# Patient Record
Sex: Female | Born: 1980 | Race: Black or African American | Hispanic: No | Marital: Single | Smoking: Never smoker
Health system: Southern US, Community
[De-identification: ages and names within clinical notes are randomized; demographics above are authoritative.]

## PROBLEM LIST (undated history)

## (undated) ENCOUNTER — Inpatient Hospital Stay (HOSPITAL_COMMUNITY): Payer: Self-pay

## (undated) DIAGNOSIS — N390 Urinary tract infection, site not specified: Secondary | ICD-10-CM

## (undated) DIAGNOSIS — N12 Tubulo-interstitial nephritis, not specified as acute or chronic: Secondary | ICD-10-CM

## (undated) DIAGNOSIS — N83209 Unspecified ovarian cyst, unspecified side: Secondary | ICD-10-CM

## (undated) HISTORY — PX: WISDOM TOOTH EXTRACTION: SHX21

## (undated) HISTORY — PX: NO PAST SURGERIES: SHX2092

---

## 2002-11-13 ENCOUNTER — Inpatient Hospital Stay (HOSPITAL_COMMUNITY): Admission: AD | Admit: 2002-11-13 | Discharge: 2002-11-13 | Payer: Self-pay | Admitting: Family Medicine

## 2003-02-26 ENCOUNTER — Encounter: Payer: Self-pay | Admitting: Emergency Medicine

## 2003-02-26 ENCOUNTER — Emergency Department (HOSPITAL_COMMUNITY): Admission: EM | Admit: 2003-02-26 | Discharge: 2003-02-26 | Payer: Self-pay | Admitting: Emergency Medicine

## 2003-03-05 ENCOUNTER — Emergency Department (HOSPITAL_COMMUNITY): Admission: EM | Admit: 2003-03-05 | Discharge: 2003-03-05 | Payer: Self-pay | Admitting: *Deleted

## 2003-07-05 ENCOUNTER — Emergency Department (HOSPITAL_COMMUNITY): Admission: EM | Admit: 2003-07-05 | Discharge: 2003-07-05 | Payer: Self-pay | Admitting: Emergency Medicine

## 2004-04-28 ENCOUNTER — Ambulatory Visit: Payer: Self-pay | Admitting: Obstetrics and Gynecology

## 2004-04-30 ENCOUNTER — Ambulatory Visit: Payer: Self-pay | Admitting: Family Medicine

## 2004-04-30 ENCOUNTER — Inpatient Hospital Stay (HOSPITAL_COMMUNITY): Admission: AD | Admit: 2004-04-30 | Discharge: 2004-05-02 | Payer: Self-pay | Admitting: *Deleted

## 2008-04-21 ENCOUNTER — Emergency Department (HOSPITAL_BASED_OUTPATIENT_CLINIC_OR_DEPARTMENT_OTHER): Admission: EM | Admit: 2008-04-21 | Discharge: 2008-04-21 | Payer: Self-pay | Admitting: Emergency Medicine

## 2011-04-17 ENCOUNTER — Encounter (HOSPITAL_COMMUNITY): Payer: Self-pay

## 2011-04-17 ENCOUNTER — Inpatient Hospital Stay (HOSPITAL_COMMUNITY)
Admission: AD | Admit: 2011-04-17 | Discharge: 2011-04-17 | Disposition: A | Discharge status: Home / Self Care | Payer: PRIVATE HEALTH INSURANCE | Source: Ambulatory Visit | Attending: Obstetrics & Gynecology | Admitting: Obstetrics & Gynecology

## 2011-04-17 ENCOUNTER — Inpatient Hospital Stay (HOSPITAL_COMMUNITY): Payer: PRIVATE HEALTH INSURANCE

## 2011-04-17 DIAGNOSIS — A499 Bacterial infection, unspecified: Secondary | ICD-10-CM

## 2011-04-17 DIAGNOSIS — O2650 Maternal hypotension syndrome, unspecified trimester: Secondary | ICD-10-CM | POA: Insufficient documentation

## 2011-04-17 DIAGNOSIS — B3731 Acute candidiasis of vulva and vagina: Secondary | ICD-10-CM

## 2011-04-17 DIAGNOSIS — B9689 Other specified bacterial agents as the cause of diseases classified elsewhere: Secondary | ICD-10-CM

## 2011-04-17 DIAGNOSIS — R51 Headache: Secondary | ICD-10-CM | POA: Insufficient documentation

## 2011-04-17 DIAGNOSIS — R109 Unspecified abdominal pain: Secondary | ICD-10-CM | POA: Insufficient documentation

## 2011-04-17 DIAGNOSIS — N760 Acute vaginitis: Secondary | ICD-10-CM

## 2011-04-17 DIAGNOSIS — B373 Candidiasis of vulva and vagina: Secondary | ICD-10-CM

## 2011-04-17 DIAGNOSIS — O211 Hyperemesis gravidarum with metabolic disturbance: Secondary | ICD-10-CM

## 2011-04-17 LAB — BASIC METABOLIC PANEL
BUN: 10
CO2: 25
Calcium: 9.3
Chloride: 105
Creatinine, Ser: 0.8
GFR calc Af Amer: >60
GFR calc non Af Amer: >60
Glucose, Bld: 106 — ABNORMAL HIGH
Potassium: 3.7
Sodium: 139

## 2011-04-17 LAB — DIFFERENTIAL
Basophils Absolute: 0
Basophils Relative: 0
Eosinophils Absolute: 0
Eosinophils Relative: 0
Lymphocytes Relative: 22
Lymphs Abs: 2
Monocytes Absolute: 0.9
Monocytes Relative: 10
Neutro Abs: 6.4
Neutrophils Relative %: 68

## 2011-04-17 LAB — CBC
HCT: 35.3 — ABNORMAL LOW
HCT: 32.2 % — ABNORMAL LOW (ref 36.0–46.0)
Hemoglobin: 12.1
Hemoglobin: 11.1 g/dL — ABNORMAL LOW (ref 12.0–15.0)
MCH: 30.1 pg (ref 26.0–34.0)
MCHC: 34.3
MCHC: 34.5 g/dL (ref 30.0–36.0)
MCV: 87.1
MCV: 87.3 fL (ref 78.0–100.0)
Platelets: 181
Platelets: 213 10*3/uL (ref 150–400)
RBC: 4.05
RBC: 3.69 MIL/uL — ABNORMAL LOW (ref 3.87–5.11)
RDW: 11.9
RDW: 12 % (ref 11.5–15.5)
WBC: 9.3
WBC: 9.2 10*3/uL (ref 4.0–10.5)

## 2011-04-17 LAB — URINE MICROSCOPIC-ADD ON

## 2011-04-17 LAB — URINALYSIS, ROUTINE W REFLEX MICROSCOPIC
Bilirubin Urine: NEGATIVE
Glucose, UA: NEGATIVE
Glucose, UA: NEGATIVE mg/dL
Hgb urine dipstick: NEGATIVE
Ketones, ur: NEGATIVE
Ketones, ur: 40 mg/dL — AB
Leukocytes, UA: NEGATIVE
Nitrite: NEGATIVE
Nitrite: NEGATIVE
Protein, ur: 30 — AB
Protein, ur: NEGATIVE mg/dL
Specific Gravity, Urine: 1.019
Specific Gravity, Urine: >1.03 — ABNORMAL HIGH (ref 1.005–1.030)
Urobilinogen, UA: 1
Urobilinogen, UA: 1 mg/dL (ref 0.0–1.0)
pH: 5.5
pH: 6 (ref 5.0–8.0)

## 2011-04-17 LAB — URINE CULTURE: Colony Count: >=100000

## 2011-04-17 LAB — COMPREHENSIVE METABOLIC PANEL
ALT: 12 U/L (ref 0–35)
AST: 17 U/L (ref 0–37)
Albumin: 3.7 g/dL (ref 3.5–5.2)
Alkaline Phosphatase: 36 U/L — ABNORMAL LOW (ref 39–117)
BUN: 12 mg/dL (ref 6–23)
CO2: 25 mEq/L (ref 19–32)
Calcium: 9.5 mg/dL (ref 8.4–10.5)
Chloride: 103 mEq/L (ref 96–112)
Creatinine, Ser: 0.5 mg/dL (ref 0.50–1.10)
GFR calc Af Amer: >90 mL/min (ref >90)
GFR calc non Af Amer: >90 mL/min (ref >90)
Glucose, Bld: 78 mg/dL (ref 70–99)
Potassium: 3.9 mEq/L (ref 3.5–5.1)
Sodium: 135 mEq/L (ref 135–145)
Total Bilirubin: 0.8 mg/dL (ref 0.3–1.2)
Total Protein: 6.6 g/dL (ref 6.0–8.3)

## 2011-04-17 LAB — PREGNANCY, URINE: Preg Test, Ur: NEGATIVE

## 2011-04-17 LAB — HCG, QUANTITATIVE, PREGNANCY: hCG, Beta Chain, Quant, S: 128009 m[IU]/mL — ABNORMAL HIGH (ref <5)

## 2011-04-17 LAB — POCT PREGNANCY, URINE: Preg Test, Ur: POSITIVE

## 2011-04-17 LAB — WET PREP, GENITAL
Trich, Wet Prep: NONE SEEN
Yeast Wet Prep HPF POC: NONE SEEN

## 2011-04-17 MED ORDER — ONDANSETRON 8 MG PO TBDP
8.0000 mg | ORAL_TABLET | Freq: Once | ORAL | Status: AC
  Administered 2011-04-17: 8 mg via ORAL
  Filled 2011-04-17: qty 1

## 2011-04-17 MED ORDER — PROMETHAZINE HCL 25 MG PO TABS: 25.0000 mg | ORAL_TABLET | Freq: Four times a day (QID) | ORAL | Status: DC | PRN

## 2011-04-17 MED ORDER — PRENATAL RX 60-1 MG PO TABS: 1.0000 | ORAL_TABLET | Freq: Every day | ORAL | Status: DC

## 2011-04-17 MED ORDER — ONDANSETRON 8 MG PO TBDP: 8.0000 mg | ORAL_TABLET | Freq: Three times a day (TID) | ORAL | Status: AC | PRN

## 2011-04-17 MED ORDER — ACETAMINOPHEN 325 MG PO TABS
650.0000 mg | ORAL_TABLET | Freq: Once | ORAL | Status: AC
  Administered 2011-04-17: 650 mg via ORAL
  Filled 2011-04-17: qty 2

## 2011-04-17 MED ORDER — DEXTROSE 5 % IN LACTATED RINGERS IV BOLUS
1000.0000 mL | Freq: Once | INTRAVENOUS | Status: AC
  Administered 2011-04-17: 1000 mL via INTRAVENOUS

## 2011-04-17 MED ORDER — NYSTATIN-TRIAMCINOLONE 100000-0.1 UNIT/GM-% EX OINT: TOPICAL_OINTMENT | Freq: Two times a day (BID) | CUTANEOUS | Status: DC

## 2011-04-17 NOTE — ED Provider Notes (Signed)
History     Chief Complaint  Patient presents with  . Emesis   The history is provided by the patient.    Pt is? [redacted] weeks pregnant with LMP? 7/26 G3P2 with nausea and vomiting for 3 weeks and has progressively gotten worse since 9/29 and has not been able to eat or drink anything since.  She denies spotting or bleeding but has had cramping with vomiting. She has had a headache- diffuse frontal and radiating across the top of her head.  She has not taken any medication for the pain or for the nausea/vomiting. She has a mod white vaginal discharge with odor with a history of BV.  She is a Consulting civil engineer at [employer].  She had no complications with her previous pregnancies.  She denies UTI symtpoms, but some pressure with urination.  She took the kids to school this morning and then came back home and started vomiting and then went to the bathroom and threw up and passed out- does not think she hit her head.    No past medical history on file.  No past surgical history on file.  No family history on file.  History  Substance Use Topics  . Smoking status: Not on file  . Smokeless tobacco: Not on file  . Alcohol Use: Not on file    Allergies: No Known Allergies  No prescriptions prior to admission    Review of Systems  Constitutional: Negative for fever and chills.  Gastrointestinal: Positive for nausea, vomiting and abdominal pain. Negative for diarrhea and constipation.  Genitourinary: Negative for dysuria, urgency and frequency.  Neurological: Positive for dizziness and headaches.   Physical Exam   Blood pressure 119/59, pulse 86, temperature 99.3 F (37.4 C), temperature source Oral, resp. rate 16, height 5\' 3"  (1.6 m), weight 115 lb (52.164 kg), last menstrual period 02/10/2011, SpO2 98.00%.  Physical Exam  Vitals reviewed. Constitutional: She is oriented to person, place, and time. She appears well-developed and well-nourished.  Eyes: Pupils are equal, round, and reactive to light.    Neck: Normal range of motion. Neck supple.  Cardiovascular: Normal rate.   Respiratory: Effort normal.  GI: Soft. She exhibits no distension. There is no tenderness.  Genitourinary:       Several flat whitened patches on mons and left labia that pt says are itching- no erythema noted or raised areas Mod amount of frothy white discharge in vault; cervix clean nontender; uterus 10week size nontender; adnexal without palpable enlargement or tenderness  Musculoskeletal: Normal range of motion.  Neurological: She is alert and oriented to person, place, and time.  Skin: Skin is warm and dry.  Psychiatric: She has a normal mood and affect.    MAU Course  Procedures Pelvic exam with wet prep-mod clue cells GC/chlamydia- pending Urinalysis-40 ketones HCG-Quant CBC CMET IVF with D5LR and Zofran ODT-pt's nausea better but headache uncomfortable Tylenol ordered Ultrasound showed 10week single living IUP with FHR 170 and no abnormalities visualized    Assessment and Plan  Hyperemesis in early pregnancy with dehydration- IVF and antiemetics Near syncope/syncope episode Headache in pregnancy Cramping in pregnancy- nl ultrasound 10 weeks IUP  Lori Wolfe 04/17/2011, 11:46 AM

## 2011-04-17 NOTE — Progress Notes (Signed)
Pt states she passed out this am. Not sure if she hit anything. Has a headache but has had it for a while.

## 2011-04-17 NOTE — Progress Notes (Signed)
Pt states she has been having nausea and vomiting for a couple of weeks, for the last 3 days cannot keep anything down, slight fever and a headache.

## 2011-04-17 NOTE — ED Provider Notes (Signed)
Attestation of Attending Supervision of Advanced Practitioner: Evaluation and management procedures were performed by the PA/NP/CNM/OB Fellow under my supervision/collaboration. Chart reviewed and agree with management and plan.  Lori Wolfe A 04/17/2011 4:55 PM

## 2011-04-18 LAB — GC/CHLAMYDIA PROBE AMP, GENITAL
Chlamydia, DNA Probe: NEGATIVE
GC Probe Amp, Genital: NEGATIVE

## 2011-04-27 ENCOUNTER — Inpatient Hospital Stay (HOSPITAL_COMMUNITY): Payer: PRIVATE HEALTH INSURANCE

## 2011-04-27 ENCOUNTER — Inpatient Hospital Stay (HOSPITAL_COMMUNITY)
Admission: AD | Admit: 2011-04-27 | Discharge: 2011-04-27 | Disposition: A | Discharge status: Home / Self Care | Payer: PRIVATE HEALTH INSURANCE | Source: Ambulatory Visit | Attending: Obstetrics & Gynecology | Admitting: Obstetrics & Gynecology

## 2011-04-27 ENCOUNTER — Encounter (HOSPITAL_COMMUNITY): Payer: Self-pay | Admitting: *Deleted

## 2011-04-27 DIAGNOSIS — O418X90 Other specified disorders of amniotic fluid and membranes, unspecified trimester, not applicable or unspecified: Secondary | ICD-10-CM

## 2011-04-27 DIAGNOSIS — O468X9 Other antepartum hemorrhage, unspecified trimester: Secondary | ICD-10-CM

## 2011-04-27 DIAGNOSIS — O209 Hemorrhage in early pregnancy, unspecified: Secondary | ICD-10-CM

## 2011-04-27 HISTORY — DX: Tubulo-interstitial nephritis, not specified as acute or chronic: N12

## 2011-04-27 HISTORY — DX: Urinary tract infection, site not specified: N39.0

## 2011-04-27 LAB — CBC
HCT: 32.9 % — ABNORMAL LOW (ref 36.0–46.0)
Hemoglobin: 11.3 g/dL — ABNORMAL LOW (ref 12.0–15.0)
MCH: 30.1 pg (ref 26.0–34.0)
RBC: 3.75 MIL/uL — ABNORMAL LOW (ref 3.87–5.11)

## 2011-04-27 LAB — ABO/RH: ABO/RH(D): O NEG

## 2011-04-27 MED ORDER — RHO D IMMUNE GLOBULIN 1500 UNIT/2ML IJ SOLN
300.0000 ug | Freq: Once | INTRAMUSCULAR | Status: AC
  Administered 2011-04-27: 300 ug via INTRAMUSCULAR
  Filled 2011-04-27: qty 2

## 2011-04-27 NOTE — Progress Notes (Signed)
Pt brought into triage, bleeding through jeans. Taken directly to room 6.

## 2011-04-27 NOTE — Progress Notes (Signed)
Sudden onset cramping and bleeding about 1 hour ago. Soaked through [employer] streamer.

## 2011-04-27 NOTE — ED Provider Notes (Signed)
History   Pt presents today c/o heavy vag bleeding. She states she is about [redacted]wks pregnant. She began cramping earlier today and then noticed heavy bleeding. She denies recent intercourse, vag dc, irritation, fever, or any other sx at this time.  Chief Complaint  Patient presents with  . Vaginal Bleeding   HPI  OB History    Grav Para Term Preterm Abortions TAB SAB Ect Mult Living   3 2 2  0  0 0 0 0 2      Past Medical History  Diagnosis Date  . Pyelonephritis   . Urinary tract infection     Past Surgical History  Procedure Date  . No past surgeries     No family history on file.  History  Substance Use Topics  . Smoking status: Never Smoker   . Smokeless tobacco: Never Used  . Alcohol Use: No    Allergies: No Known Allergies  Prescriptions prior to admission  Medication Sig Dispense Refill  . acetaminophen (TYLENOL) 500 MG tablet Take 1,000 mg by mouth every 6 (six) hours as needed. For pain or headache       . ondansetron (ZOFRAN-ODT) 8 MG disintegrating tablet Take 8 mg by mouth every 8 (eight) hours as needed. For nausea       . Prenatal Vit-Fe Fumarate-FA (PRENATAL MULTIVITAMIN) 60-1 MG tablet Take 1 tablet by mouth daily.  30 tablet  5  . promethazine (PHENERGAN) 25 MG tablet Take 25 mg by mouth every 6 (six) hours as needed. For nausea       . nystatin-triamcinolone (MYCOLOG) ointment Apply topically 2 (two) times daily.  30 g  0    Review of Systems  Constitutional: Negative for fever.  Cardiovascular: Negative for chest pain.  Gastrointestinal: Positive for abdominal pain. Negative for nausea, vomiting, diarrhea and constipation.  Genitourinary: Negative for dysuria, urgency, frequency and hematuria.  Neurological: Negative for dizziness and headaches.  Psychiatric/Behavioral: Negative for depression and suicidal ideas.   Physical Exam   Blood pressure 119/68, pulse 91, temperature 98.4 F (36.9 C), temperature source Oral, resp. rate 18, height 5\' 4"   (1.626 m), weight 117 lb (53.071 kg), last menstrual period 02/10/2011.  Physical Exam  Nursing note and vitals reviewed. Constitutional: She is oriented to person, place, and time. She appears well-developed and well-nourished. No distress.  HENT:  Head: Normocephalic and atraumatic.  GI: Soft. She exhibits no distension. There is no tenderness. There is no rebound and no guarding.  Genitourinary: There is bleeding around the vagina. No vaginal discharge found.       Cervix Lg/closed. Pt with moderate amount of bleeding noted on exam. Pt nontender on exam.  Neurological: She is alert and oriented to person, place, and time.  Skin: Skin is warm and dry. She is not diaphoretic.  Psychiatric: She has a normal mood and affect. Her behavior is normal. Judgment and thought content normal.    MAU Course  Procedures RADIOLOGY REPORT*  Clinical Data: Positive pace test with vaginal bleeding  OBSTETRIC <14 WK ULTRASOUND  Technique: Transabdominal ultrasound was performed for evaluation  of the gestation as well as the maternal uterus and adnexal  regions.  Comparison: 04/17/2011  Intrauterine gestational sac: Single. Normal shape.  Yolk sac: Not visualized  Embryo: Visualized  Cardiac Activity: Visualized  Heart Rate: 153 bpm  CRL: 5.0 mm 11w 6d [place] EDC: 11/10/2011  Maternal uterus/Adnexae:  Tiny subchorionic hemorrhage is evident. No evidence for adnexal  mass. No intraperitoneal free fluid.  IMPRESSION:  Single living intrauterine gestation at estimated 11-week-6-day  gestational age by crown-rump length.     Assessment and Plan  Pt care turned over to Nolene Bernheim, FNP at 2003.   Assessment: Bleeding in pregnancy Tiny subchorionic hemorrhage  Plan: Pelvic rest - no tampons, no douching, no sex until you begin prenatal care. Take a prenatal vitamin every day Begin prenatal care as soon as possible.   Clinton Gallant. Rice III, DrHSc, MPAS, PA-C  04/27/2011, 6:39 PM    Henrietta Hoover, PA 04/27/11 2003  Nolene Bernheim, NP 04/27/11 2023

## 2011-04-27 NOTE — Progress Notes (Signed)
Pt. states she has been seen in MAU, had U/S to confirm IUP and dates. Has rx for Phenergan and Zofran. Has had some constipation and is taking Colace.

## 2011-04-28 LAB — RH IG WORKUP (INCLUDES ABO/RH)
Antibody Screen: NEGATIVE
Gestational Age(Wks): 10.6

## 2011-04-29 NOTE — ED Provider Notes (Signed)
Agree with above note.  [name] 04/29/2011 7:55 AM

## 2011-07-17 NOTE — L&D Delivery Note (Signed)
Delivery Note At 5:19 PM a viable female was delivered via Vaginal, Spontaneous Delivery (Presentation: ;  ).  APGAR: 9, 10; weight 6 lb 4 oz (2835 g).   Placenta status: Intact, Spontaneous.  Cord: 3 vessels with the following complications: None.  Cord pH: 7.32  Anesthesia: Epidural  Episiotomy: None Lacerations: None Suture Repair: none Est. Blood Loss (mL): 200  Mom to postpartum.  Baby to nursery-stable.  HARPER,CHARLES A 11/06/2011, 9:49 AM

## 2011-08-27 ENCOUNTER — Other Ambulatory Visit: Payer: Self-pay | Admitting: Obstetrics & Gynecology

## 2011-08-31 ENCOUNTER — Inpatient Hospital Stay (HOSPITAL_COMMUNITY)
Admission: AD | Admit: 2011-08-31 | Discharge: 2011-08-31 | Disposition: A | Discharge status: Home / Self Care | Payer: Medicaid Other | Source: Ambulatory Visit | Attending: Obstetrics | Admitting: Obstetrics

## 2011-08-31 DIAGNOSIS — Z298 Encounter for other specified prophylactic measures: Secondary | ICD-10-CM | POA: Insufficient documentation

## 2011-08-31 DIAGNOSIS — Z2989 Encounter for other specified prophylactic measures: Secondary | ICD-10-CM | POA: Insufficient documentation

## 2011-08-31 DIAGNOSIS — Z3480 Encounter for supervision of other normal pregnancy, unspecified trimester: Secondary | ICD-10-CM | POA: Insufficient documentation

## 2011-08-31 MED ORDER — RHO D IMMUNE GLOBULIN 1500 UNIT/2ML IJ SOLN
300.0000 ug | Freq: Once | INTRAMUSCULAR | Status: AC
  Administered 2011-08-31: 300 ug via INTRAMUSCULAR
  Filled 2011-08-31: qty 2

## 2011-08-31 MED ORDER — RHO D IMMUNE GLOBULIN 1500 UNIT/2ML IJ SOLN: 300.0000 ug | Freq: Once | INTRAMUSCULAR | Status: DC

## 2011-08-31 NOTE — Progress Notes (Signed)
Sent from office for rhophyllac.  Handout/info given to pt.  Has received previously, no reaction.  No complaints offered. Time associated with blood drawn and injection discussed.

## 2011-08-31 NOTE — ED Notes (Signed)
Tolerated injection. Pt d/c

## 2011-09-01 LAB — RH IG WORKUP (INCLUDES ABO/RH)
Antibody Screen: NEGATIVE
Unit division: 0

## 2011-11-05 ENCOUNTER — Encounter (HOSPITAL_COMMUNITY): Payer: Self-pay | Admitting: Anesthesiology

## 2011-11-05 ENCOUNTER — Inpatient Hospital Stay (HOSPITAL_COMMUNITY): Payer: Medicaid Other | Admitting: Anesthesiology

## 2011-11-05 ENCOUNTER — Encounter (HOSPITAL_COMMUNITY): Payer: Self-pay | Admitting: *Deleted

## 2011-11-05 ENCOUNTER — Inpatient Hospital Stay (HOSPITAL_COMMUNITY)
Admission: AD | Admit: 2011-11-05 | Discharge: 2011-11-07 | DRG: 775 | Disposition: A | Discharge status: Home / Self Care | Payer: Medicaid Other | Source: Ambulatory Visit | Attending: Obstetrics & Gynecology | Admitting: Obstetrics & Gynecology

## 2011-11-05 DIAGNOSIS — Z22330 Carrier of Group B streptococcus: Secondary | ICD-10-CM

## 2011-11-05 DIAGNOSIS — O99892 Other specified diseases and conditions complicating childbirth: Principal | ICD-10-CM | POA: Diagnosis present

## 2011-11-05 DIAGNOSIS — IMO0001 Reserved for inherently not codable concepts without codable children: Secondary | ICD-10-CM

## 2011-11-05 LAB — CBC
MCH: 28 pg (ref 26.0–34.0)
MCHC: 32.4 g/dL (ref 30.0–36.0)
MCV: 86.6 fL (ref 78.0–100.0)
Platelets: 240 10*3/uL (ref 150–400)
RDW: 13.5 % (ref 11.5–15.5)

## 2011-11-05 MED ORDER — FENTANYL 2.5 MCG/ML BUPIVACAINE 1/10 % EPIDURAL INFUSION (WH - ANES)
INTRAMUSCULAR | Status: DC | PRN
  Administered 2011-11-05: 14 mL/h via EPIDURAL

## 2011-11-05 MED ORDER — FLEET ENEMA 7-19 GM/118ML RE ENEM: 1.0000 | ENEMA | RECTAL | Status: DC | PRN

## 2011-11-05 MED ORDER — SENNOSIDES-DOCUSATE SODIUM 8.6-50 MG PO TABS
2.0000 | ORAL_TABLET | Freq: Every day | ORAL | Status: DC
  Administered 2011-11-06: 2 via ORAL

## 2011-11-05 MED ORDER — EPHEDRINE 5 MG/ML INJ: 10.0000 mg | INTRAVENOUS | Status: DC | PRN

## 2011-11-05 MED ORDER — OXYCODONE-ACETAMINOPHEN 5-325 MG PO TABS
1.0000 | ORAL_TABLET | ORAL | Status: DC | PRN
Start: 2011-11-05 — End: 2011-11-07
  Filled 2011-11-05: qty 1

## 2011-11-05 MED ORDER — LACTATED RINGERS IV SOLN
INTRAVENOUS | Status: DC
  Administered 2011-11-05: 14:00:00 via INTRAVENOUS

## 2011-11-05 MED ORDER — DIBUCAINE 1 % RE OINT: 1.0000 "application " | TOPICAL_OINTMENT | RECTAL | Status: DC | PRN

## 2011-11-05 MED ORDER — EPHEDRINE 5 MG/ML INJ
10.0000 mg | INTRAVENOUS | Status: DC | PRN
  Filled 2011-11-05: qty 4

## 2011-11-05 MED ORDER — OXYCODONE-ACETAMINOPHEN 5-325 MG PO TABS: 1.0000 | ORAL_TABLET | ORAL | Status: DC | PRN

## 2011-11-05 MED ORDER — LIDOCAINE HCL (PF) 1 % IJ SOLN
INTRAMUSCULAR | Status: DC | PRN
  Administered 2011-11-05 (×2): 8 mL

## 2011-11-05 MED ORDER — TETANUS-DIPHTH-ACELL PERTUSSIS 5-2.5-18.5 LF-MCG/0.5 IM SUSP: 0.5000 mL | Freq: Once | INTRAMUSCULAR | Status: DC

## 2011-11-05 MED ORDER — FENTANYL 2.5 MCG/ML BUPIVACAINE 1/10 % EPIDURAL INFUSION (WH - ANES)
14.0000 mL/h | INTRAMUSCULAR | Status: DC
  Administered 2011-11-05: 14 mL/h via EPIDURAL
  Filled 2011-11-05 (×2): qty 60

## 2011-11-05 MED ORDER — PENICILLIN G POTASSIUM 5000000 UNITS IJ SOLR
2.5000 10*6.[IU] | INTRAVENOUS | Status: DC
  Filled 2011-11-05 (×3): qty 2.5

## 2011-11-05 MED ORDER — BUTORPHANOL TARTRATE 2 MG/ML IJ SOLN
INTRAMUSCULAR | Status: AC
  Filled 2011-11-05: qty 1

## 2011-11-05 MED ORDER — PRENATAL MULTIVITAMIN CH
1.0000 | ORAL_TABLET | Freq: Every day | ORAL | Status: DC
  Administered 2011-11-06 – 2011-11-07 (×2): 1 via ORAL
  Filled 2011-11-05 (×2): qty 1

## 2011-11-05 MED ORDER — CITRIC ACID-SODIUM CITRATE 334-500 MG/5ML PO SOLN: 30.0000 mL | ORAL | Status: DC | PRN

## 2011-11-05 MED ORDER — ONDANSETRON HCL 4 MG PO TABS: 4.0000 mg | ORAL_TABLET | ORAL | Status: DC | PRN

## 2011-11-05 MED ORDER — ACETAMINOPHEN 325 MG PO TABS: 650.0000 mg | ORAL_TABLET | ORAL | Status: DC | PRN

## 2011-11-05 MED ORDER — SIMETHICONE 80 MG PO CHEW: 80.0000 mg | CHEWABLE_TABLET | ORAL | Status: DC | PRN

## 2011-11-05 MED ORDER — OXYTOCIN BOLUS FROM INFUSION
500.0000 mL | Freq: Once | INTRAVENOUS | Status: DC
  Filled 2011-11-05: qty 500
  Filled 2011-11-05: qty 1000

## 2011-11-05 MED ORDER — LACTATED RINGERS IV SOLN
500.0000 mL | Freq: Once | INTRAVENOUS | Status: AC
  Administered 2011-11-05: 15:00:00 via INTRAVENOUS

## 2011-11-05 MED ORDER — LANOLIN HYDROUS EX OINT: TOPICAL_OINTMENT | CUTANEOUS | Status: DC | PRN

## 2011-11-05 MED ORDER — BENZOCAINE-MENTHOL 20-0.5 % EX AERO: 1.0000 "application " | INHALATION_SPRAY | CUTANEOUS | Status: DC | PRN

## 2011-11-05 MED ORDER — ONDANSETRON HCL 4 MG/2ML IJ SOLN: 4.0000 mg | Freq: Four times a day (QID) | INTRAMUSCULAR | Status: DC | PRN

## 2011-11-05 MED ORDER — LIDOCAINE HCL (PF) 1 % IJ SOLN: 30.0000 mL | INTRAMUSCULAR | Status: DC | PRN

## 2011-11-05 MED ORDER — IBUPROFEN 600 MG PO TABS
600.0000 mg | ORAL_TABLET | Freq: Four times a day (QID) | ORAL | Status: DC
  Administered 2011-11-06 – 2011-11-07 (×7): 600 mg via ORAL
  Filled 2011-11-05 (×3): qty 1

## 2011-11-05 MED ORDER — PENICILLIN G POTASSIUM 5000000 UNITS IJ SOLR
5.0000 10*6.[IU] | Freq: Once | INTRAVENOUS | Status: AC
  Administered 2011-11-05: 5 10*6.[IU] via INTRAVENOUS
  Filled 2011-11-05: qty 5

## 2011-11-05 MED ORDER — DIPHENHYDRAMINE HCL 50 MG/ML IJ SOLN
12.5000 mg | INTRAMUSCULAR | Status: DC | PRN
Start: 2011-11-05 — End: 2011-11-05

## 2011-11-05 MED ORDER — ONDANSETRON HCL 4 MG/2ML IJ SOLN: 4.0000 mg | INTRAMUSCULAR | Status: DC | PRN

## 2011-11-05 MED ORDER — LACTATED RINGERS IV SOLN: 500.0000 mL | INTRAVENOUS | Status: DC | PRN

## 2011-11-05 MED ORDER — PHENYLEPHRINE 40 MCG/ML (10ML) SYRINGE FOR IV PUSH (FOR BLOOD PRESSURE SUPPORT)
80.0000 ug | PREFILLED_SYRINGE | INTRAVENOUS | Status: DC | PRN
  Filled 2011-11-05: qty 5

## 2011-11-05 MED ORDER — ZOLPIDEM TARTRATE 5 MG PO TABS: 5.0000 mg | ORAL_TABLET | Freq: Every evening | ORAL | Status: DC | PRN

## 2011-11-05 MED ORDER — OXYTOCIN 20 UNITS IN LACTATED RINGERS INFUSION - SIMPLE: 125.0000 mL/h | Freq: Once | INTRAVENOUS | Status: DC

## 2011-11-05 MED ORDER — DIPHENHYDRAMINE HCL 25 MG PO CAPS: 25.0000 mg | ORAL_CAPSULE | Freq: Four times a day (QID) | ORAL | Status: DC | PRN

## 2011-11-05 MED ORDER — PHENYLEPHRINE 40 MCG/ML (10ML) SYRINGE FOR IV PUSH (FOR BLOOD PRESSURE SUPPORT): 80.0000 ug | PREFILLED_SYRINGE | INTRAVENOUS | Status: DC | PRN

## 2011-11-05 MED ORDER — WITCH HAZEL-GLYCERIN EX PADS: 1.0000 "application " | MEDICATED_PAD | CUTANEOUS | Status: DC | PRN

## 2011-11-05 MED ORDER — IBUPROFEN 600 MG PO TABS
600.0000 mg | ORAL_TABLET | Freq: Four times a day (QID) | ORAL | Status: DC | PRN
  Filled 2011-11-05 (×4): qty 1

## 2011-11-05 MED ORDER — BUTORPHANOL TARTRATE 2 MG/ML IJ SOLN
1.0000 mg | INTRAMUSCULAR | Status: DC | PRN
  Administered 2011-11-05: 1 mg via INTRAVENOUS

## 2011-11-05 NOTE — Anesthesia Procedure Notes (Signed)
Epidural Patient location during procedure: OB Start time: 11/05/2011 2:28 PM End time: 11/05/2011 2:32 PM Reason for block: procedure for pain  Staffing Anesthesiologist: Sandrea Hughs  Preanesthetic Checklist Completed: patient identified, site marked, surgical consent, pre-op evaluation, timeout performed, IV checked, risks and benefits discussed and monitors and equipment checked  Epidural Patient position: sitting Prep: site prepped and draped and DuraPrep Patient monitoring: continuous pulse ox and blood pressure Approach: midline Injection technique: LOR air  Needle:  Needle type: Tuohy  Needle gauge: 17 G Needle length: 9 cm Needle insertion depth: 5 cm cm Catheter type: closed end flexible Catheter size: 19 Gauge Catheter at skin depth: 10 cm Test dose: negative and Other  Assessment Sensory level: T8 Events: blood not aspirated, injection not painful, no injection resistance, negative IV test and no paresthesia

## 2011-11-05 NOTE — MAU Note (Signed)
U/C's started approx. At 0400 this am and have progressively gotten stronger and closer together.  No vaginal bleeding or ROM.

## 2011-11-05 NOTE — MAU Note (Signed)
Pt brought from lobby directly to rm for eval.  States ctx started at 0400, no bleeding or leaking. 3rd baby, no problems with preg.

## 2011-11-05 NOTE — Anesthesia Preprocedure Evaluation (Signed)

## 2011-11-05 NOTE — H&P (Signed)
Lori Wolfe is a 31 y.o. female presenting for contractions. Maternal Medical History:  Reason for admission: Reason for admission: contractions.  Contractions: Frequency: regular.    Prenatal complications: no prenatal complications   OB History    Grav Para Term Preterm Abortions TAB SAB Ect Mult Living   3 2 2  0  0 0 0 0 2     Past Medical History  Diagnosis Date  . Pyelonephritis   . Urinary tract infection    Past Surgical History  Procedure Date  . No past surgeries   . Wisdom tooth extraction    Family History: family history is negative for Anesthesia problems. Social History:  reports that she has never smoked. She has never used smokeless tobacco. She reports that she does not drink alcohol or use illicit drugs.  Review of Systems  Constitutional: Negative for fever.  Eyes: Negative for blurred vision.  Respiratory: Negative for shortness of breath.   Gastrointestinal: Negative for vomiting.  Skin: Negative for rash.  Neurological: Negative for headaches.    Dilation: 6 Effacement (%): 90 Station: -1 Exam by:: lee Blood pressure 116/63, pulse 58, temperature 98.5 F (36.9 C), temperature source Oral, resp. rate 16, height 5\' 4"  (1.626 m), weight 69.4 kg (153 lb), last menstrual period 02/10/2011, SpO2 100.00%. Maternal Exam:  Uterine Assessment: Contraction frequency is regular.   Abdomen: Patient reports no abdominal tenderness. Fetal presentation: vertex  Introitus: not evaluated.   Cervix: Cervix evaluated by digital exam.     Fetal Exam Fetal Monitor Review: Variability: moderate (6-25 bpm).   Pattern: variable decelerations.    Fetal State Assessment: Category I - tracings are normal.     Physical Exam  Constitutional: She appears well-developed.  HENT:  Head: Normocephalic.  Neck: Neck supple. No thyromegaly present.  Cardiovascular: Normal rate and regular rhythm.   Respiratory: Breath sounds normal.  GI: Soft. Bowel sounds are  normal.  Skin: No rash noted.    Prenatal labs: ABO, Rh: --/--/O NEG (02/15 1325) Antibody: NEG (02/15 1325) Rubella:   RPR:    HBsAg:    HIV:    GBS: Positive (03/28 0000)   Assessment/Plan: Multipara at term, active labor, Category 1 FHT; GBS pos Admit, anticipate an NSVD PCN GBS prophylaxis   JACKSON-MOORE,Porsche Noguchi A 11/05/2011, 3:44 PM

## 2011-11-06 LAB — CBC
HCT: 31.5 % — ABNORMAL LOW (ref 36.0–46.0)
MCH: 27.6 pg (ref 26.0–34.0)
MCHC: 31.4 g/dL (ref 30.0–36.0)
MCV: 87.7 fL (ref 78.0–100.0)
RDW: 13.8 % (ref 11.5–15.5)

## 2011-11-06 MED ORDER — RHO D IMMUNE GLOBULIN 1500 UNIT/2ML IJ SOLN
300.0000 ug | Freq: Once | INTRAMUSCULAR | Status: AC
  Administered 2011-11-06: 300 ug via INTRAMUSCULAR
  Filled 2011-11-06: qty 2

## 2011-11-06 NOTE — Progress Notes (Signed)
UR chart review completed.  

## 2011-11-06 NOTE — Progress Notes (Signed)
Post Partum Day 1 Subjective: no complaints, up ad lib, voiding, tolerating PO and + flatus  Objective: Blood pressure 114/70, pulse 80, temperature 98.5 F (36.9 C), temperature source Oral, resp. rate 18, height 5\' 4"  (1.626 m), weight 69.4 kg (153 lb), last menstrual period 02/10/2011, SpO2 99.00%, unknown if currently breastfeeding.  Physical Exam:  General: alert and no distress Lochia: appropriate Uterine Fundus: firm Incision: healing well DVT Evaluation: No evidence of DVT seen on physical exam.   Basename 11/06/11 0545 11/05/11 1400  HGB 9.9* 11.3*  HCT 31.5* 34.9*    Assessment/Plan: Plan for discharge tomorrow   LOS: 1 day   Lori Wolfe A 11/06/2011, 9:56 AM

## 2011-11-06 NOTE — Anesthesia Postprocedure Evaluation (Signed)
  Anesthesia Post-op Note  Patient: Lori Wolfe  Procedure(s) Performed: * No procedures listed *  Patient Location: PACU and Mother/Baby  Anesthesia Type: Epidural  Level of Consciousness: awake, alert  and oriented  Airway and Oxygen Therapy: Patient Spontanous Breathing  Post-op Pain: mild  Post-op Assessment: Patient's Cardiovascular Status Stable, Respiratory Function Stable, No signs of Nausea or vomiting, No headache, No residual numbness and No residual motor weakness  Post-op Vital Signs: stable  Complications: No apparent anesthesia complications

## 2011-11-07 LAB — RH IG WORKUP (INCLUDES ABO/RH): Unit division: 0

## 2011-11-07 MED ORDER — IBUPROFEN 800 MG PO TABS: 800.0000 mg | ORAL_TABLET | Freq: Three times a day (TID) | ORAL | Status: AC | PRN

## 2011-11-07 MED ORDER — OXYCODONE-ACETAMINOPHEN 5-325 MG PO TABS: 1.0000 | ORAL_TABLET | ORAL | Status: AC | PRN

## 2011-11-07 NOTE — Discharge Summary (Signed)
Obstetric Discharge Summary Reason for Admission: onset of labor Prenatal Procedures: ultrasound Intrapartum Procedures: spontaneous vaginal delivery Postpartum Procedures: none Complications-Operative and Postpartum: none Hemoglobin  Date Value Range Status  11/06/2011 9.9* 12.0-15.0 (g/dL) Final     HCT  Date Value Range Status  11/06/2011 31.5* 36.0-46.0 (%) Final    Physical Exam:  General: alert and no distress Lochia: appropriate Uterine Fundus: firm Incision: n/a DVT Evaluation: No evidence of DVT seen on physical exam.  Discharge Diagnoses: Term Pregnancy-delivered  Discharge Information: Date: 11/07/2011 Activity: pelvic rest Diet: routine Medications: PNV, Ibuprofen, Colace and Percocet Condition: stable Instructions: refer to practice specific booklet Discharge to: home Follow-up Information    Follow up with Starlit Raburn A, MD. Schedule an appointment as soon as possible for a visit in 6 weeks.   Contact information:   [address] [place] [address] [phone]          Newborn Data: Live born female  Birth Weight: 6 lb 4 oz (2835 g) APGAR: 9, 10  Home with mother.  Klare Criss A 11/07/2011, 8:39 AM

## 2011-11-07 NOTE — Progress Notes (Signed)
Post Partum Day 2 Subjective: no complaints  Objective: Blood pressure 103/66, pulse 67, temperature 98.1 F (36.7 C), temperature source Oral, resp. rate 18, height 5\' 4"  (1.626 m), weight 69.4 kg (153 lb), last menstrual period 02/10/2011, SpO2 99.00%, unknown if currently breastfeeding.  Physical Exam:  General: alert and no distress Lochia: appropriate Uterine Fundus: firm Incision: n/a DVT Evaluation: No evidence of DVT seen on physical exam.   Basename 11/06/11 0545 11/05/11 1400  HGB 9.9* 11.3*  HCT 31.5* 34.9*    Assessment/Plan: Discharge home   LOS: 2 days   Merikay Lesniewski A 11/07/2011, 7:44 AM

## 2014-01-14 ENCOUNTER — Emergency Department (HOSPITAL_BASED_OUTPATIENT_CLINIC_OR_DEPARTMENT_OTHER)
Admission: EM | Admit: 2014-01-14 | Discharge: 2014-01-14 | Disposition: A | Discharge status: Home / Self Care | Payer: Medicaid Other | Attending: Emergency Medicine | Admitting: Emergency Medicine

## 2014-01-14 ENCOUNTER — Encounter (HOSPITAL_BASED_OUTPATIENT_CLINIC_OR_DEPARTMENT_OTHER): Payer: Self-pay | Admitting: Emergency Medicine

## 2014-01-14 DIAGNOSIS — Z3202 Encounter for pregnancy test, result negative: Secondary | ICD-10-CM | POA: Insufficient documentation

## 2014-01-14 DIAGNOSIS — Z87440 Personal history of urinary (tract) infections: Secondary | ICD-10-CM | POA: Insufficient documentation

## 2014-01-14 DIAGNOSIS — Z792 Long term (current) use of antibiotics: Secondary | ICD-10-CM | POA: Insufficient documentation

## 2014-01-14 DIAGNOSIS — Z79899 Other long term (current) drug therapy: Secondary | ICD-10-CM | POA: Insufficient documentation

## 2014-01-14 DIAGNOSIS — N12 Tubulo-interstitial nephritis, not specified as acute or chronic: Secondary | ICD-10-CM | POA: Insufficient documentation

## 2014-01-14 LAB — URINALYSIS, ROUTINE W REFLEX MICROSCOPIC
Bilirubin Urine: NEGATIVE
Glucose, UA: NEGATIVE mg/dL
Ketones, ur: 15 mg/dL — AB
NITRITE: NEGATIVE
PH: 8 (ref 5.0–8.0)
Protein, ur: 30 mg/dL — AB
SPECIFIC GRAVITY, URINE: 1.025 (ref 1.005–1.030)
UROBILINOGEN UA: 1 mg/dL (ref 0.0–1.0)

## 2014-01-14 LAB — CBC WITH DIFFERENTIAL/PLATELET
BASOS ABS: 0 10*3/uL (ref 0.0–0.1)
Basophils Relative: 0 % (ref 0–1)
EOS PCT: 0 % (ref 0–5)
Eosinophils Absolute: 0 10*3/uL (ref 0.0–0.7)
HCT: 34.4 % — ABNORMAL LOW (ref 36.0–46.0)
Hemoglobin: 11.5 g/dL — ABNORMAL LOW (ref 12.0–15.0)
LYMPHS PCT: 9 % — AB (ref 12–46)
Lymphs Abs: 1 10*3/uL (ref 0.7–4.0)
MCH: 29.8 pg (ref 26.0–34.0)
MCHC: 33.4 g/dL (ref 30.0–36.0)
MCV: 89.1 fL (ref 78.0–100.0)
Monocytes Absolute: 0.8 10*3/uL (ref 0.1–1.0)
Monocytes Relative: 7 % (ref 3–12)
NEUTROS ABS: 9.2 10*3/uL — AB (ref 1.7–7.7)
NEUTROS PCT: 84 % — AB (ref 43–77)
PLATELETS: 186 10*3/uL (ref 150–400)
RBC: 3.86 MIL/uL — ABNORMAL LOW (ref 3.87–5.11)
RDW: 12.2 % (ref 11.5–15.5)
WBC: 11 10*3/uL — AB (ref 4.0–10.5)

## 2014-01-14 LAB — BASIC METABOLIC PANEL
ANION GAP: 13 (ref 5–15)
BUN: 12 mg/dL (ref 6–23)
CALCIUM: 9.4 mg/dL (ref 8.4–10.5)
CO2: 23 meq/L (ref 19–32)
Chloride: 102 mEq/L (ref 96–112)
Creatinine, Ser: 0.8 mg/dL (ref 0.50–1.10)
GFR calc Af Amer: >90 mL/min (ref >90)
Glucose, Bld: 99 mg/dL (ref 70–99)
POTASSIUM: 4 meq/L (ref 3.7–5.3)
SODIUM: 138 meq/L (ref 137–147)

## 2014-01-14 LAB — URINE MICROSCOPIC-ADD ON

## 2014-01-14 LAB — PREGNANCY, URINE: PREG TEST UR: NEGATIVE

## 2014-01-14 MED ORDER — SODIUM CHLORIDE 0.9 % IV BOLUS (SEPSIS): 1000.0000 mL | Freq: Once | INTRAVENOUS | Status: DC

## 2014-01-14 MED ORDER — HYDROCODONE-ACETAMINOPHEN 5-325 MG PO TABS: 2.0000 | ORAL_TABLET | ORAL | Status: DC | PRN

## 2014-01-14 MED ORDER — KETOROLAC TROMETHAMINE 30 MG/ML IJ SOLN
30.0000 mg | Freq: Once | INTRAMUSCULAR | Status: AC
  Administered 2014-01-14: 30 mg via INTRAVENOUS
  Filled 2014-01-14: qty 1

## 2014-01-14 MED ORDER — SODIUM CHLORIDE 0.9 % IV BOLUS (SEPSIS)
1000.0000 mL | Freq: Once | INTRAVENOUS | Status: AC
  Administered 2014-01-14: 1000 mL via INTRAVENOUS

## 2014-01-14 MED ORDER — ACETAMINOPHEN 325 MG PO TABS
650.0000 mg | ORAL_TABLET | Freq: Once | ORAL | Status: AC
  Administered 2014-01-14: 650 mg via ORAL
  Filled 2014-01-14: qty 2

## 2014-01-14 MED ORDER — DEXTROSE 5 % IV SOLN
1.0000 g | Freq: Once | INTRAVENOUS | Status: AC
  Administered 2014-01-14: 1 g via INTRAVENOUS

## 2014-01-14 MED ORDER — ONDANSETRON HCL 4 MG/2ML IJ SOLN
4.0000 mg | Freq: Once | INTRAMUSCULAR | Status: AC
  Administered 2014-01-14: 4 mg via INTRAVENOUS
  Filled 2014-01-14: qty 2

## 2014-01-14 MED ORDER — CEFTRIAXONE SODIUM 1 G IJ SOLR
INTRAMUSCULAR | Status: AC
  Filled 2014-01-14: qty 10

## 2014-01-14 MED ORDER — CEPHALEXIN 500 MG PO CAPS: 500.0000 mg | ORAL_CAPSULE | Freq: Four times a day (QID) | ORAL | Status: DC

## 2014-01-14 MED ORDER — ONDANSETRON HCL 4 MG PO TABS: 4.0000 mg | ORAL_TABLET | Freq: Four times a day (QID) | ORAL | Status: DC

## 2014-01-14 NOTE — ED Notes (Signed)
Patient states she woke up four days ago with pain left lower back pain.  States the pain has continued and is associated with urinary frequency, and fever up to 102.

## 2014-01-14 NOTE — Discharge Instructions (Signed)
Pyelonephritis, Adult Take the antibiotics as prescribed. Follow up with your doctor. Return to the ED if you develop worsening pain, fever, confusion, or any other concerns. Pyelonephritis is a kidney infection. In general, there are 2 main types of pyelonephritis:  Infections that come on quickly without any warning (acute pyelonephritis).  Infections that persist for a long period of time (chronic pyelonephritis). CAUSES  Two main causes of pyelonephritis are:  Bacteria traveling from the bladder to the kidney. This is a problem especially in pregnant women. The urine in the bladder can become filled with bacteria from multiple causes, including:  Inflammation of the prostate gland (prostatitis).  Sexual intercourse in females.  Bladder infection (cystitis).  Bacteria traveling from the bloodstream to the tissue part of the kidney. Problems that may increase your risk of getting a kidney infection include:  Diabetes.  Kidney stones or bladder stones.  Cancer.  Catheters placed in the bladder.  Other abnormalities of the kidney or ureter. SYMPTOMS   Abdominal pain.  Pain in the side or flank area.  Fever.  Chills.  Upset stomach.  Blood in the urine (dark urine).  Frequent urination.  Strong or persistent urge to urinate.  Burning or stinging when urinating. DIAGNOSIS  Your caregiver may diagnose your kidney infection based on your symptoms. A urine sample may also be taken. TREATMENT  In general, treatment depends on how severe the infection is.   If the infection is mild and caught early, your caregiver may treat you with oral antibiotics and send you home.  If the infection is more severe, the bacteria may have gotten into the bloodstream. This will require intravenous (IV) antibiotics and a hospital stay. Symptoms may include:  High fever.  Severe flank pain.  Shaking chills.  Even after a hospital stay, your caregiver may require you to be on  oral antibiotics for a period of time.  Other treatments may be required depending upon the cause of the infection. HOME CARE INSTRUCTIONS   Take your antibiotics as directed. Finish them even if you start to feel better.  Make an appointment to have your urine checked to make sure the infection is gone.  Drink enough fluids to keep your urine clear or pale yellow.  Take medicines for the bladder if you have urgency and frequency of urination as directed by your caregiver. SEEK IMMEDIATE MEDICAL CARE IF:   You have a fever or persistent symptoms for more than 2-3 days.  You have a fever and your symptoms suddenly get worse.  You are unable to take your antibiotics or fluids.  You develop shaking chills.  You experience extreme weakness or fainting.  There is no improvement after 2 days of treatment. MAKE SURE YOU:  Understand these instructions.  Will watch your condition.  Will get help right away if you are not doing well or get worse. Document Released: 07/02/2005 Document Revised: 01/01/2012 Document Reviewed: 12/06/2010 [facility] Patient Information 2015 [employer] [place]. This information is not intended to replace advice given to you by your health care provider. Make sure you discuss any questions you have with your health care provider.

## 2014-01-14 NOTE — ED Provider Notes (Signed)
CSN: [id number]     Arrival date & time 01/14/14  [id number] History  This chart was scribed for Glynn OctaveStephen Juwana Thoreson, MD by Shari HeritageAisha Amuda, ED Scribe. The patient was seen in room MH03/MH03. Patient's care was started at 10:40 AM.   Chief Complaint  Patient presents with  . Urinary Frequency  . Flank Pain    The history is provided by the patient. No language interpreter was used.    HPI Comments: Army FossaCallie M Wolfe is a 33 y.o. female with history of pyelonephritis who presents to the Emergency Department complaining of constant, left flank pain that began 4 days ago upon waking. Pain is worse with movement. Patient is also complaining of constant urinary frequency, urgency, and decreased urine with voids for the past 1 day. She reports associated fever, nausea, vomiting and bitemporal headache. Her temperature at triage was 100.8 and has increased in the department to 101.8 despite taking ibuprofen at home prior to arrival. Her last episode of emesis was in the ED. Her last bowel movement was 3 days ago. She denies cough, abdominal pain, vaginal discharge or vaginal bleeding. Her last menstrual period was last month, but they are not regular because she has an Implanon implant.   Past Medical History  Diagnosis Date  . Pyelonephritis   . Urinary tract infection    Past Surgical History  Procedure Laterality Date  . No past surgeries    . Wisdom tooth extraction     Family History  Problem Relation Age of Onset  . Anesthesia problems Neg Hx    History  Substance Use Topics  . Smoking status: Never Smoker   . Smokeless tobacco: Never Used  . Alcohol Use: Yes     Comment: occassional   OB History   Grav Para Term Preterm Abortions TAB SAB Ect Mult Living   3 3 3  0  0 0 0 0 3     Review of Systems A complete 10 system review of systems was obtained and all systems are negative except as noted in the HPI and PMH.   Allergies  Review of patient's allergies indicates no known  allergies.  Home Medications   Prior to Admission medications   Medication Sig Start Date End Date Taking? Authorizing Provider  ibuprofen (ADVIL,MOTRIN) 400 MG tablet Take 400 mg by mouth every 6 (six) hours as needed.   Yes Historical Provider, MD  cephALEXin (KEFLEX) 500 MG capsule Take 1 capsule (500 mg total) by mouth 4 (four) times daily. 01/14/14   Glynn OctaveStephen Stone Spirito, MD  clotrimazole (LOTRIMIN) 1 % cream Apply 1 application topically 2 (two) times daily as needed. For itching/irritation    Historical Provider, MD  HYDROcodone-acetaminophen (NORCO/VICODIN) 5-325 MG per tablet Take 2 tablets by mouth every 4 (four) hours as needed. 01/14/14   Glynn OctaveStephen Nevada Kirchner, MD  ondansetron (ZOFRAN) 4 MG tablet Take 1 tablet (4 mg total) by mouth every 6 (six) hours. 01/14/14   Glynn OctaveStephen Deby Adger, MD  Prenatal Vit-Fe Fumarate-FA (PRENATAL MULTIVITAMIN) TABS Take 1 tablet by mouth at bedtime.    Historical Provider, MD   Triage Vitals: BP 96/57  Pulse 122  Temp(Src) 101.8 F (38.8 C) (Oral)  Resp 18  Ht 5' 4.5" (1.638 m)  Wt 115 lb (52.164 kg)  BMI 19.44 kg/m2  SpO2 100%  Physical Exam  Nursing note and vitals reviewed. Constitutional: She is oriented to person, place, and time. She appears well-developed and well-nourished. No distress.  HENT:  Head: Normocephalic and atraumatic.  Mouth/Throat: Oropharynx  is clear and moist. No oropharyngeal exudate.  Eyes: Conjunctivae and EOM are normal. Pupils are equal, round, and reactive to light.  Neck: Normal range of motion. Neck supple.  No meningismus.  Cardiovascular: Normal rate, regular rhythm, normal heart sounds and intact distal pulses.   No murmur heard. Pulmonary/Chest: Effort normal and breath sounds normal. No respiratory distress.  Abdominal: Soft. There is no tenderness. There is no rebound and no guarding.  Musculoskeletal: Normal range of motion. She exhibits tenderness. She exhibits no edema.  L CVAT  Neurological: She is alert and oriented  to person, place, and time. No cranial nerve deficit. She exhibits normal muscle tone. Coordination normal.  No ataxia on finger to nose bilaterally. No pronator drift. 5/5 strength throughout. CN 2-12 intact. Negative Romberg. Equal grip strength. Sensation intact. Gait is normal.   Skin: Skin is warm.  Psychiatric: She has a normal mood and affect. Her behavior is normal.    ED Course  Procedures (including critical care time) DIAGNOSTIC STUDIES: Oxygen Saturation is 100% on room air, normal by my interpretation.    COORDINATION OF CARE: 10:45 AM- Patient informed of current plan for treatment and evaluation and agrees with plan at this time.   12:07 PM - BP at last check was 93/50 but patient states this is normal for her. Updated patient on lab results, suspect pyelonephritis so have ordered rocephin. Patient is still having flank pain. Will give additional pain medicine and IV fluids, and order PO challenge.  1:09 PM - Patient says flank pain is improved, but still with headache. No vomiting in the ED. Patien was able to tolerate fluids. Low suspicion for meningitis. Feel patient is stable for discharge. Advised of return precautions and patient is in agreement with plan.    Labs Review Labs Reviewed  URINALYSIS, ROUTINE W REFLEX MICROSCOPIC - Abnormal; Notable for the following:    APPearance CLOUDY (*)    Hgb urine dipstick MODERATE (*)    Ketones, ur 15 (*)    Protein, ur 30 (*)    Leukocytes, UA LARGE (*)    All other components within normal limits  URINE MICROSCOPIC-ADD ON - Abnormal; Notable for the following:    Squamous Epithelial / LPF FEW (*)    Bacteria, UA MANY (*)    All other components within normal limits  CBC WITH DIFFERENTIAL - Abnormal; Notable for the following:    WBC 11.0 (*)    RBC 3.86 (*)    Hemoglobin 11.5 (*)    HCT 34.4 (*)    Neutrophils Relative % 84 (*)    Neutro Abs 9.2 (*)    Lymphocytes Relative 9 (*)    All other components within  normal limits  URINE CULTURE  PREGNANCY, URINE  BASIC METABOLIC PANEL     MDM   Final diagnoses:  Pyelonephritis   3 day history of left-sided low back pain. Fever and dysuria and frequency onset today. Vomiting x 1 this morning.  Chemistry within normal limits. Urinalysis shows large leukocytes, many bacteria and white cells. We'll treat as pyelonephritis. Leukocytosis of 11. Culture sent.  Heart rate has improved to 88. Patient states her normal blood pressures in the 90s. Her abdomen is soft and nontender. She is tolerating PO in the ED.  Will treat with antibiotics, symptom control. Follow up with PCP this week. Return precautions discussed.  BP 96/46  Pulse 88  Temp(Src) 98.6 F (37 C) (Oral)  Resp 16  Ht 5' 4.5" (1.638 m)  Wt 115 lb (52.164 kg)  BMI 19.44 kg/m2  SpO2 98%    I personally performed the services described in this documentation, which was scribed in my presence. The recorded information has been reviewed and is accurate.   [name], MD 01/14/14 [phone]

## 2014-01-15 LAB — URINE CULTURE: Colony Count: 30000

## 2014-05-17 ENCOUNTER — Encounter (HOSPITAL_BASED_OUTPATIENT_CLINIC_OR_DEPARTMENT_OTHER): Payer: Self-pay | Admitting: Emergency Medicine

## 2014-07-17 ENCOUNTER — Encounter (HOSPITAL_BASED_OUTPATIENT_CLINIC_OR_DEPARTMENT_OTHER): Payer: Self-pay | Admitting: *Deleted

## 2014-07-17 ENCOUNTER — Emergency Department (HOSPITAL_BASED_OUTPATIENT_CLINIC_OR_DEPARTMENT_OTHER)
Admission: EM | Admit: 2014-07-17 | Discharge: 2014-07-17 | Disposition: A | Discharge status: Home / Self Care | Payer: 59 | Attending: Emergency Medicine | Admitting: Emergency Medicine

## 2014-07-17 DIAGNOSIS — J0300 Acute streptococcal tonsillitis, unspecified: Secondary | ICD-10-CM | POA: Insufficient documentation

## 2014-07-17 DIAGNOSIS — Z792 Long term (current) use of antibiotics: Secondary | ICD-10-CM | POA: Insufficient documentation

## 2014-07-17 DIAGNOSIS — Z87440 Personal history of urinary (tract) infections: Secondary | ICD-10-CM | POA: Insufficient documentation

## 2014-07-17 DIAGNOSIS — R509 Fever, unspecified: Secondary | ICD-10-CM | POA: Diagnosis present

## 2014-07-17 DIAGNOSIS — Z79899 Other long term (current) drug therapy: Secondary | ICD-10-CM | POA: Insufficient documentation

## 2014-07-17 DIAGNOSIS — J020 Streptococcal pharyngitis: Secondary | ICD-10-CM

## 2014-07-17 LAB — RAPID STREP SCREEN (MED CTR MEBANE ONLY): Streptococcus, Group A Screen (Direct): POSITIVE — AB

## 2014-07-17 MED ORDER — DEXAMETHASONE 4 MG PO TABS
ORAL_TABLET | ORAL | Status: AC
  Filled 2014-07-17: qty 3

## 2014-07-17 MED ORDER — DEXAMETHASONE 4 MG PO TABS
10.0000 mg | ORAL_TABLET | Freq: Once | ORAL | Status: AC
  Administered 2014-07-17: 10 mg via ORAL

## 2014-07-17 MED ORDER — PENICILLIN V POTASSIUM 500 MG PO TABS: 500.0000 mg | ORAL_TABLET | Freq: Two times a day (BID) | ORAL | Status: AC

## 2014-07-17 NOTE — ED Notes (Signed)
Fever, sore throat, body aches x 1 day- also intermittent dizziness

## 2014-07-17 NOTE — ED Provider Notes (Signed)
CSN: [id number]     Arrival date & time 07/17/14  1848 History  This chart was scribed for Lori Fossa, MD by Modena Jansky, ED Scribe. This patient was seen in room MH09/MH09 and the patient's care was started at 8:00 PM.   Chief Complaint  Patient presents with  . Fever   The history is provided by the patient. No language interpreter was used.   HPI Comments: Lori Wolfe is a 34 y.o. female who presents to the Emergency Department complaining of a moderate intermittent fever that started 2 days ago. She reports that she has been sick with headache, sore throat, generalized myalgias, and dizziness. She states that temperature by ear was 101. Pt's temperature in the ED is 99.8. She reports that it hurts for her to drink. She denies any cough or vomiting. Symptoms are moderate, constant, worsening.  Past Medical History  Diagnosis Date  . Pyelonephritis   . Urinary tract infection    Past Surgical History  Procedure Laterality Date  . No past surgeries    . Wisdom tooth extraction     Family History  Problem Relation Age of Onset  . Anesthesia problems Neg Hx    History  Substance Use Topics  . Smoking status: Never Smoker   . Smokeless tobacco: Never Used  . Alcohol Use: Yes     Comment: occassional   OB History    Gravida Para Term Preterm AB TAB SAB Ectopic Multiple Living   0  0 0 0 0 3     Review of Systems  Constitutional: Positive for fever.  HENT: Positive for sore throat.   Respiratory: Negative for cough.   Gastrointestinal: Negative for vomiting.  Musculoskeletal: Positive for myalgias (generalized).  Neurological: Positive for dizziness and headaches.  All other systems reviewed and are negative.   Allergies  Review of patient's allergies indicates no known allergies.  Home Medications   Prior to Admission medications   Medication Sig Start Date End Date Taking? Authorizing Provider  ibuprofen (ADVIL,MOTRIN) 400 MG tablet Take 400 mg by  mouth every 6 (six) hours as needed.   Yes Historical Provider, MD  cephALEXin (KEFLEX) 500 MG capsule Take 1 capsule (500 mg total) by mouth 4 (four) times daily. 01/14/14   [name], MD  clotrimazole (LOTRIMIN) 1 % cream Apply 1 application topically 2 (two) times daily as needed. For itching/irritation    Historical Provider, MD  HYDROcodone-acetaminophen (NORCO/VICODIN) 5-325 MG per tablet Take 2 tablets by mouth every 4 (four) hours as needed. 01/14/14   [name], MD  ondansetron (ZOFRAN) 4 MG tablet Take 1 tablet (4 mg total) by mouth every 6 (six) hours. 01/14/14   [name], MD  Prenatal Vit-Fe Fumarate-FA (PRENATAL MULTIVITAMIN) TABS Take 1 tablet by mouth at bedtime.    Historical Provider, MD   BP 108/59 mmHg  Pulse 102  Temp(Src) 99.8 F (37.7 C) (Oral)  Resp 20  Ht  (1.626 m)  Wt 115 lb (52.164 kg)  BMI 19.73 kg/m2  SpO2 100% Physical Exam  Constitutional: She is oriented to person, place, and time. She appears well-developed and well-nourished.  HENT:  Head: Normocephalic and atraumatic.  Right Ear: External ear normal.  Mouth/Throat: Oropharyngeal exudate present.  Oropharyngeal erythema, edema, and exudates.   Cardiovascular: Normal rate and regular rhythm.   No murmur heard. Pulmonary/Chest: Effort normal and breath sounds normal. No respiratory distress.  Abdominal: Soft. There is no tenderness. There is no rebound  and no guarding.  Musculoskeletal: She exhibits no edema or tenderness.  Neurological: She is alert and oriented to person, place, and time.  Skin: Skin is warm and dry.  Psychiatric: She has a normal mood and affect. Her behavior is normal.  Nursing note and vitals reviewed.   ED Course  Procedures (including critical care time) DIAGNOSTIC STUDIES: Oxygen Saturation is 100% on RA, normal by my interpretation.    COORDINATION OF CARE: 8:04 PM- Pt advised of plan for treatment which includes medication and labs and pt  agrees.  Labs Review Labs Reviewed  RAPID STREP SCREEN - Abnormal; Notable for the following:    Streptococcus, Group A Screen (Direct) POSITIVE (*)    All other components within normal limits    Imaging Review No results found.   EKG Interpretation None      MDM   Final diagnoses:  Acute streptococcal pharyngitis   Patient here for evaluation of fevers and sore throat. Patient is nontoxic appearing on exam and well-hydrated, there is no respiratory distress. There is no evidence of peritonsillar abscess. Rapid strep is positive, we'll treat for strep pharyngitis with Penicillin VK. Providing one-time dose of Decadron for symptomatically relief. Discussed with patient pain control with Tylenol or ibuprofen and oral fluid hydration as well as return precautions.  I personally performed the services described in this documentation, which was scribed in my presence. The recorded information has been reviewed and is accurate.     Lori Fossa, MD 07/18/14 Moses Manners

## 2014-07-17 NOTE — Discharge Instructions (Signed)

## 2014-11-09 ENCOUNTER — Encounter (HOSPITAL_BASED_OUTPATIENT_CLINIC_OR_DEPARTMENT_OTHER): Payer: Self-pay | Admitting: *Deleted

## 2014-11-09 ENCOUNTER — Emergency Department (HOSPITAL_BASED_OUTPATIENT_CLINIC_OR_DEPARTMENT_OTHER)
Admission: EM | Admit: 2014-11-09 | Discharge: 2014-11-10 | Disposition: A | Discharge status: Home / Self Care | Payer: Medicaid Other | Attending: Emergency Medicine | Admitting: Emergency Medicine

## 2014-11-09 DIAGNOSIS — Z792 Long term (current) use of antibiotics: Secondary | ICD-10-CM | POA: Diagnosis not present

## 2014-11-09 DIAGNOSIS — N12 Tubulo-interstitial nephritis, not specified as acute or chronic: Secondary | ICD-10-CM | POA: Insufficient documentation

## 2014-11-09 DIAGNOSIS — Z79899 Other long term (current) drug therapy: Secondary | ICD-10-CM | POA: Insufficient documentation

## 2014-11-09 DIAGNOSIS — Z3202 Encounter for pregnancy test, result negative: Secondary | ICD-10-CM | POA: Diagnosis not present

## 2014-11-09 DIAGNOSIS — N730 Acute parametritis and pelvic cellulitis: Secondary | ICD-10-CM | POA: Diagnosis not present

## 2014-11-09 DIAGNOSIS — Z87440 Personal history of urinary (tract) infections: Secondary | ICD-10-CM | POA: Insufficient documentation

## 2014-11-09 DIAGNOSIS — R1030 Lower abdominal pain, unspecified: Secondary | ICD-10-CM | POA: Diagnosis present

## 2014-11-09 LAB — PREGNANCY, URINE: Preg Test, Ur: NEGATIVE

## 2014-11-09 LAB — CBC WITH DIFFERENTIAL/PLATELET
Basophils Absolute: 0 10*3/uL (ref 0.0–0.1)
Basophils Relative: 0 % (ref 0–1)
Eosinophils Absolute: 0.1 10*3/uL (ref 0.0–0.7)
Eosinophils Relative: 1 % (ref 0–5)
HCT: 34.7 % — ABNORMAL LOW (ref 36.0–46.0)
Hemoglobin: 11.5 g/dL — ABNORMAL LOW (ref 12.0–15.0)
Lymphocytes Relative: 11 % — ABNORMAL LOW (ref 12–46)
Lymphs Abs: 1.5 10*3/uL (ref 0.7–4.0)
MCH: 29.5 pg (ref 26.0–34.0)
MCHC: 33.1 g/dL (ref 30.0–36.0)
MCV: 89 fL (ref 78.0–100.0)
Monocytes Absolute: 0.6 10*3/uL (ref 0.1–1.0)
Monocytes Relative: 4 % (ref 3–12)
Neutro Abs: 11.9 10*3/uL — ABNORMAL HIGH (ref 1.7–7.7)
Neutrophils Relative %: 84 % — ABNORMAL HIGH (ref 43–77)
Platelets: 213 10*3/uL (ref 150–400)
RBC: 3.9 MIL/uL (ref 3.87–5.11)
RDW: 12.2 % (ref 11.5–15.5)
WBC: 14 10*3/uL — ABNORMAL HIGH (ref 4.0–10.5)

## 2014-11-09 LAB — COMPREHENSIVE METABOLIC PANEL
ALT: 15 U/L (ref 0–35)
AST: 19 U/L (ref 0–37)
Albumin: 4.4 g/dL (ref 3.5–5.2)
Alkaline Phosphatase: 30 U/L — ABNORMAL LOW (ref 39–117)
Anion gap: 5 (ref 5–15)
BUN: 14 mg/dL (ref 6–23)
CO2: 27 mmol/L (ref 19–32)
Calcium: 9.2 mg/dL (ref 8.4–10.5)
Chloride: 106 mmol/L (ref 96–112)
Creatinine, Ser: 0.71 mg/dL (ref 0.50–1.10)
GFR calc Af Amer: >90 mL/min (ref >90)
GFR calc non Af Amer: >90 mL/min (ref >90)
Glucose, Bld: 107 mg/dL — ABNORMAL HIGH (ref 70–99)
Potassium: 3.6 mmol/L (ref 3.5–5.1)
Sodium: 138 mmol/L (ref 135–145)
Total Bilirubin: 1 mg/dL (ref 0.3–1.2)
Total Protein: 7.1 g/dL (ref 6.0–8.3)

## 2014-11-09 LAB — WET PREP, GENITAL
Trich, Wet Prep: NONE SEEN
Yeast Wet Prep HPF POC: NONE SEEN

## 2014-11-09 LAB — URINE MICROSCOPIC-ADD ON

## 2014-11-09 LAB — URINALYSIS, ROUTINE W REFLEX MICROSCOPIC
Bilirubin Urine: NEGATIVE
GLUCOSE, UA: NEGATIVE mg/dL
KETONES UR: NEGATIVE mg/dL
NITRITE: POSITIVE — AB
Protein, ur: 100 mg/dL — AB
SPECIFIC GRAVITY, URINE: 1.017 (ref 1.005–1.030)
UROBILINOGEN UA: 1 mg/dL (ref 0.0–1.0)
pH: 6 (ref 5.0–8.0)

## 2014-11-09 LAB — LIPASE, BLOOD: Lipase: 21 U/L (ref 11–59)

## 2014-11-09 MED ORDER — CEFTRIAXONE SODIUM 250 MG IJ SOLR
250.0000 mg | Freq: Once | INTRAMUSCULAR | Status: AC
  Administered 2014-11-09: 250 mg via INTRAMUSCULAR
  Filled 2014-11-09: qty 250

## 2014-11-09 MED ORDER — LIDOCAINE HCL (PF) 1 % IJ SOLN
INTRAMUSCULAR | Status: AC
  Administered 2014-11-09: 5 mL
  Filled 2014-11-09: qty 5

## 2014-11-09 MED ORDER — ONDANSETRON HCL 4 MG PO TABS: 4.0000 mg | ORAL_TABLET | Freq: Four times a day (QID) | ORAL | Status: DC

## 2014-11-09 MED ORDER — DOXYCYCLINE HYCLATE 100 MG PO CAPS: 100.0000 mg | ORAL_CAPSULE | Freq: Two times a day (BID) | ORAL | Status: DC

## 2014-11-09 MED ORDER — CIPROFLOXACIN HCL 500 MG PO TABS: 500.0000 mg | ORAL_TABLET | Freq: Two times a day (BID) | ORAL | Status: DC

## 2014-11-09 NOTE — Discharge Instructions (Signed)
Pelvic Inflammatory Disease °Pelvic inflammatory disease (PID) refers to an infection in some or all of the female organs. The infection can be in the uterus, ovaries, fallopian tubes, or the surrounding tissues in the pelvis. PID can cause abdominal or pelvic pain that comes on suddenly (acute pelvic pain). PID is a serious infection because it can lead to lasting (chronic) pelvic pain or the inability to have children (infertile).  °CAUSES  °The infection is often caused by the normal bacteria found in the vaginal tissues. PID may also be caused by an infection that is spread during sexual contact. PID can also occur following:  °· The birth of a baby.   °· A miscarriage.   °· An abortion.   °· Major pelvic surgery.   °· The use of an intrauterine device (IUD).   °· A sexual assault.   °RISK FACTORS °Certain factors can put a person at higher risk for PID, such as: °· Being younger than 25 years. °· Being sexually active at a young age. °· Using nonbarrier contraception. °· Having multiple sexual partners. °· Having sex with someone who has symptoms of a genital infection. °· Using oral contraception. °Other times, certain behaviors can increase the possibility of getting PID, such as: °· Having sex during your period. °· Using a vaginal douche. °· Having an intrauterine device (IUD) in place. °SYMPTOMS  °· Abdominal or pelvic pain.   °· Fever.   °· Chills.   °· Abnormal vaginal discharge. °· Abnormal uterine bleeding.   °· Unusual pain shortly after finishing your period. °DIAGNOSIS  °Your caregiver will choose some of the following methods to make a diagnosis, such as:  °· Performing a physical exam and history. A pelvic exam typically reveals a very tender uterus and surrounding pelvis.   °· Ordering laboratory tests including a pregnancy test, blood tests, and urine test.  °· Ordering cultures of the vagina and cervix to check for a sexually transmitted infection (STI). °· Performing an ultrasound.    °· Performing a laparoscopic procedure to look inside the pelvis.   °TREATMENT  °· Antibiotic medicines may be prescribed and taken by mouth.   °· Sexual partners may be treated when the infection is caused by a sexually transmitted disease (STD).   °· Hospitalization may be needed to give antibiotics intravenously. °· Surgery may be needed, but this is rare. °It may take weeks until you are completely well. If you are diagnosed with PID, you should also be checked for human immunodeficiency virus (HIV).   °HOME CARE INSTRUCTIONS  °· If given, take your antibiotics as directed. Finish the medicine even if you start to feel better.   °· Only take over-the-counter or prescription medicines for pain, discomfort, or fever as directed by your caregiver.   °· Do not have sexual intercourse until treatment is completed or as directed by your caregiver. If PID is confirmed, your recent sexual partner(s) will need treatment.   °· Keep your follow-up appointments. °SEEK MEDICAL CARE IF:  °· You have increased or abnormal vaginal discharge.   °· You need prescription medicine for your pain.   °· You vomit.   °· You cannot take your medicines.   °· Your partner has an STD.   °SEEK IMMEDIATE MEDICAL CARE IF:  °· You have a fever.   °· You have increased abdominal or pelvic pain.   °· You have chills.   °· You have pain when you urinate.   °· You are not better after 72 hours following treatment.   °MAKE SURE YOU:  °· Understand these instructions. °· Will watch your condition. °· Will get help right away if you are not doing well or get worse. °  Document Released: 07/02/2005 Document Revised: 10/27/2012 Document Reviewed: 06/28/2011 [facility] Patient Information 2015 [place] [place]. This information is not intended to replace advice given to you by your health care provider. Make sure you discuss any questions you have with your health care provider.  Please use medication as directed, monitoring for worsening signs or  symptoms. Please return for follow-up evaluation if concerning signs or symptoms present. See above instructions

## 2014-11-09 NOTE — ED Provider Notes (Signed)
CSN: [id number]     Arrival date & time 11/09/14  1843 History   First MD Initiated Contact with Patient 11/09/14 1958     Chief Complaint  Patient presents with  . Abdominal Pain   HPI   34 year old female presents today with dysuria, frequency, lower abdominal pain and one episode of non bloody emesis. Patient reports that this morning around 10 AM she started to experience left lower abdominal pain, described as "cramping" bilateral lower back pain, and urinary symptoms including dysuria, frequency, discoloration. She reports significant past medical history of pyelonephritis, PID, chlamydial infection. She notes that over the past week she has a white discharge, no odor. Patient reports that she is sexually active. Patient denies headaches, chest pain, lower extremity swelling or edema. She reports her last normal menstrual cycle started at the beginning of this month she is unable to confirm the date, reporting that it was 2 weeks long, this is normal for her over the last few months.  Patient denies fever, chest pain, vaginal bleeding, or any other concerning signs and symptoms in addition to those noted above.  Past Medical History  Diagnosis Date  . Pyelonephritis   . Urinary tract infection    Past Surgical History  Procedure Laterality Date  . No past surgeries    . Wisdom tooth extraction     Family History  Problem Relation Age of Onset  . Anesthesia problems Neg Hx    History  Substance Use Topics  . Smoking status: Never Smoker   . Smokeless tobacco: Never Used  . Alcohol Use: Yes     Comment: occassional   OB History    Gravida Para Term Preterm AB TAB SAB Ectopic Multiple Living   0  0 0 0 0 3     Review of Systems  All other systems reviewed and are negative.   Allergies  Review of patient's allergies indicates no known allergies.  Home Medications   Prior to Admission medications   Medication Sig Start Date End Date Taking? Authorizing Provider   Multiple Vitamin (MULTI VITAMIN DAILY PO) Take by mouth.   Yes Historical Provider, MD  cephALEXin (KEFLEX) 500 MG capsule Take 1 capsule (500 mg total) by mouth 4 (four) times daily. 01/14/14   [name], MD  clotrimazole (LOTRIMIN) 1 % cream Apply 1 application topically 2 (two) times daily as needed. For itching/irritation    Historical Provider, MD  HYDROcodone-acetaminophen (NORCO/VICODIN) 5-325 MG per tablet Take 2 tablets by mouth every 4 (four) hours as needed. 01/14/14   [name], MD  ibuprofen (ADVIL,MOTRIN) 400 MG tablet Take 400 mg by mouth every 6 (six) hours as needed.    Historical Provider, MD  ondansetron (ZOFRAN) 4 MG tablet Take 1 tablet (4 mg total) by mouth every 6 (six) hours. 01/14/14   [name], MD  Prenatal Vit-Fe Fumarate-FA (PRENATAL MULTIVITAMIN) TABS Take 1 tablet by mouth at bedtime.    Historical Provider, MD   BP 112/52 mmHg  Pulse 82  Temp(Src) 99 F (37.2 C) (Oral)  Resp 16  Ht  (1.626 m)  Wt 115 lb (52.164 kg)  BMI 19.73 kg/m2  SpO2 98% Physical Exam  Constitutional: She is oriented to person, place, and time. She appears well-developed and well-nourished.  HENT:  Head: Normocephalic and atraumatic.  Eyes: Pupils are equal, round, and reactive to light.  Neck: Normal range of motion. Neck supple. No JVD present. No tracheal deviation present. No thyromegaly present.  Cardiovascular: Normal rate, regular rhythm, normal heart sounds and intact distal pulses.  Exam reveals no gallop and no friction rub.   No murmur heard. Pulmonary/Chest: Effort normal and breath sounds normal. No stridor. No respiratory distress. She has no wheezes. She has no rales. She exhibits no tenderness.  Abdominal: There is no splenomegaly or hepatomegaly. There is CVA tenderness. There is no rigidity, no rebound, no guarding, no tenderness at McBurney's point and negative Murphy's sign.  Patient mildlytender to right upper quadrant, left lower quadrant,  bilateral CVA tenderness.  Genitourinary: There is no rash, tenderness, lesion or injury on the right labia. There is no rash, tenderness, lesion or injury on the left labia. Uterus is deviated. Cervix exhibits motion tenderness and discharge. Cervix exhibits no friability. Right adnexum displays no mass, no tenderness and no fullness. Left adnexum displays no mass, no tenderness and no fullness. No erythema, tenderness or bleeding in the vagina. No foreign body around the vagina. No signs of injury around the vagina. No vaginal discharge found.  Musculoskeletal: Normal range of motion.  Lymphadenopathy:    She has no cervical adenopathy.  Neurological: She is alert and oriented to person, place, and time. Coordination normal.  Skin: Skin is warm and dry.  Psychiatric: She has a normal mood and affect. Her behavior is normal. Judgment and thought content normal.  Nursing note and vitals reviewed.   ED Course  Procedures (including critical care time) Labs Review Labs Reviewed  URINALYSIS, ROUTINE W REFLEX MICROSCOPIC - Abnormal; Notable for the following:    APPearance TURBID (*)    Hgb urine dipstick LARGE (*)    Protein, ur 100 (*)    Nitrite POSITIVE (*)    Leukocytes, UA LARGE (*)    All other components within normal limits  URINE MICROSCOPIC-ADD ON - Abnormal; Notable for the following:    Squamous Epithelial / LPF FEW (*)    Bacteria, UA FEW (*)    All other components within normal limits  WET PREP, GENITAL  PREGNANCY, URINE  COMPREHENSIVE METABOLIC PANEL  CBC WITH DIFFERENTIAL/PLATELET  RPR  HIV ANTIBODY (ROUTINE TESTING)  LIPASE, BLOOD  GC/CHLAMYDIA PROBE AMP ([place])    Imaging Review No results found.   EKG Interpretation None      MDM   Final diagnoses:  Pyelonephritis  PID (acute pelvic inflammatory disease)   Labs:urinalysis, pregnancy, CBC, CMP, lipase, wet prep, GC chlamydia, HIV and syphilis  Significant for elevated WBC of 14, nitrite and  leukocyte urine, pregnancy negative  Imaging: none indicated  Consults:none  Therapeutics:ceftriaxone  Assessment: Pyelonephritis, PID  Plan: patient's presentation likely mixed picture. She has bilateral tenderness to palpation CVA, and urine that indicates urinary tract infection. This most likely represents pyelonephritis. He had one episode of vomiting earlier today, this was isolated. Patient also reports left lower quadrant pain with palpation, she is not peritoneal, no guarding, no acute onset of pain. This coupled with her vaginal discharge, history of STD,  history of PID, and cervical motion tenderness makes PID unlikely diagnosis. Patient was given a dose of ceftriaxone here and discharged home with doxycycline and ciprofloxacin as needed for PID and pyelonephritis, respectively. She was given Zofran as needed for nausea and vomiting. Patient was afebrile, is a nausea and vomiting during her stay, with stable vital signs. He is discharged home with instructions to return if she is unable to maintain adequate hydration and oral antibiotic therapy, if she has increased abdominal pain,[place], vaginal bleeding, or other concerning  signs or symptoms. Patient understood and agreed the plan and assured her follow-up.    Eyvonne MechanicJeffrey Nareh Matzke, PA-C 11/09/14 2353  Benjiman CoreNathan Pickering, MD 11/10/14 [phone]

## 2014-11-09 NOTE — ED Notes (Signed)
Dysuria, frequency, abdominal cramps, and vomiting since 11am.

## 2014-11-11 LAB — RPR: RPR Ser Ql: NONREACTIVE

## 2014-11-11 LAB — GC/CHLAMYDIA PROBE AMP (~~LOC~~) NOT AT ARMC
Chlamydia: NEGATIVE
Neisseria Gonorrhea: NEGATIVE

## 2014-11-11 LAB — HIV ANTIBODY (ROUTINE TESTING W REFLEX): HIV Screen 4th Generation wRfx: NONREACTIVE

## 2014-11-12 LAB — URINE CULTURE: Colony Count: >=100000

## 2014-11-17 ENCOUNTER — Telehealth (HOSPITAL_BASED_OUTPATIENT_CLINIC_OR_DEPARTMENT_OTHER): Payer: Self-pay | Admitting: Emergency Medicine

## 2014-11-17 NOTE — Telephone Encounter (Signed)
Post ED Visit - Positive Culture Follow-up  Culture report reviewed by antimicrobial stewardship pharmacist: []  Wes Dulaney, Pharm.D., BCPS [x]  Celedonio MiyamotoJeremy Frens, Pharm.D., BCPS []  Georgina PillionElizabeth Martin, Pharm.D., BCPS []  Bryson CityMinh Pham, [address]Ph[address]D., BCPS, AAHIVP []  Estella HuskMichelle Turner, Pharm.D., BCPS, AAHIVP []  Elder CyphersLorie Poole, [address]Pharm[address]D., BCPS  Positive urine culture E. coli Treated with doxycycline and ciprofloxacin, organism sensitive to the same and no further patient follow-up is required at this time.  Berle MullMiller, Markis Langland 11/17/2014, 11:45 AM

## 2015-11-03 ENCOUNTER — Emergency Department (HOSPITAL_BASED_OUTPATIENT_CLINIC_OR_DEPARTMENT_OTHER): Payer: Medicaid Other

## 2015-11-03 ENCOUNTER — Emergency Department (HOSPITAL_BASED_OUTPATIENT_CLINIC_OR_DEPARTMENT_OTHER)
Admission: EM | Admit: 2015-11-03 | Discharge: 2015-11-03 | Disposition: A | Discharge status: Home / Self Care | Payer: Medicaid Other | Attending: Emergency Medicine | Admitting: Emergency Medicine

## 2015-11-03 ENCOUNTER — Encounter (HOSPITAL_BASED_OUTPATIENT_CLINIC_OR_DEPARTMENT_OTHER): Payer: Self-pay | Admitting: *Deleted

## 2015-11-03 DIAGNOSIS — Z87440 Personal history of urinary (tract) infections: Secondary | ICD-10-CM | POA: Diagnosis not present

## 2015-11-03 DIAGNOSIS — R109 Unspecified abdominal pain: Secondary | ICD-10-CM | POA: Diagnosis present

## 2015-11-03 DIAGNOSIS — R102 Pelvic and perineal pain: Secondary | ICD-10-CM | POA: Diagnosis not present

## 2015-11-03 DIAGNOSIS — Z792 Long term (current) use of antibiotics: Secondary | ICD-10-CM | POA: Insufficient documentation

## 2015-11-03 DIAGNOSIS — Z87448 Personal history of other diseases of urinary system: Secondary | ICD-10-CM | POA: Insufficient documentation

## 2015-11-03 DIAGNOSIS — R1032 Left lower quadrant pain: Secondary | ICD-10-CM | POA: Insufficient documentation

## 2015-11-03 DIAGNOSIS — R110 Nausea: Secondary | ICD-10-CM | POA: Insufficient documentation

## 2015-11-03 DIAGNOSIS — R195 Other fecal abnormalities: Secondary | ICD-10-CM | POA: Diagnosis not present

## 2015-11-03 DIAGNOSIS — Z3202 Encounter for pregnancy test, result negative: Secondary | ICD-10-CM | POA: Insufficient documentation

## 2015-11-03 LAB — COMPREHENSIVE METABOLIC PANEL
ALT: 41 U/L (ref 14–54)
AST: 48 U/L — AB (ref 15–41)
Albumin: 4.2 g/dL (ref 3.5–5.0)
Alkaline Phosphatase: 29 U/L — ABNORMAL LOW (ref 38–126)
Anion gap: 5 (ref 5–15)
BILIRUBIN TOTAL: 1.1 mg/dL (ref 0.3–1.2)
BUN: 20 mg/dL (ref 6–20)
CO2: 26 mmol/L (ref 22–32)
Calcium: 9.1 mg/dL (ref 8.9–10.3)
Chloride: 107 mmol/L (ref 101–111)
Creatinine, Ser: 0.7 mg/dL (ref 0.44–1.00)
Glucose, Bld: 91 mg/dL (ref 65–99)
POTASSIUM: 3.5 mmol/L (ref 3.5–5.1)
Sodium: 138 mmol/L (ref 135–145)
TOTAL PROTEIN: 6.9 g/dL (ref 6.5–8.1)

## 2015-11-03 LAB — CBC WITH DIFFERENTIAL/PLATELET
Basophils Absolute: 0 10*3/uL (ref 0.0–0.1)
Basophils Relative: 0 %
Eosinophils Absolute: 0.4 10*3/uL (ref 0.0–0.7)
Eosinophils Relative: 8 %
HCT: 34 % — ABNORMAL LOW (ref 36.0–46.0)
HEMOGLOBIN: 11.4 g/dL — AB (ref 12.0–15.0)
LYMPHS ABS: 2 10*3/uL (ref 0.7–4.0)
LYMPHS PCT: 36 %
MCH: 29.8 pg (ref 26.0–34.0)
MCHC: 33.5 g/dL (ref 30.0–36.0)
MCV: 89 fL (ref 78.0–100.0)
MONOS PCT: 6 %
Monocytes Absolute: 0.4 10*3/uL (ref 0.1–1.0)
NEUTROS PCT: 50 %
Neutro Abs: 2.8 10*3/uL (ref 1.7–7.7)
Platelets: 207 10*3/uL (ref 150–400)
RBC: 3.82 MIL/uL — AB (ref 3.87–5.11)
RDW: 12.2 % (ref 11.5–15.5)
WBC: 5.6 10*3/uL (ref 4.0–10.5)

## 2015-11-03 LAB — RAPID HIV SCREEN (HIV 1/2 AB+AG)
HIV 1/2 Antibodies: NONREACTIVE
HIV-1 P24 Antigen - HIV24: NONREACTIVE

## 2015-11-03 LAB — URINALYSIS, ROUTINE W REFLEX MICROSCOPIC
Bilirubin Urine: NEGATIVE
GLUCOSE, UA: NEGATIVE mg/dL
Hgb urine dipstick: NEGATIVE
Ketones, ur: NEGATIVE mg/dL
Nitrite: NEGATIVE
PH: 7 (ref 5.0–8.0)
Protein, ur: NEGATIVE mg/dL
Specific Gravity, Urine: 1.018 (ref 1.005–1.030)

## 2015-11-03 LAB — URINE MICROSCOPIC-ADD ON: RBC / HPF: NONE SEEN RBC/hpf (ref 0–5)

## 2015-11-03 LAB — WET PREP, GENITAL
Clue Cells Wet Prep HPF POC: NONE SEEN
SPERM: NONE SEEN
Trich, Wet Prep: NONE SEEN
Yeast Wet Prep HPF POC: NONE SEEN

## 2015-11-03 LAB — PREGNANCY, URINE: Preg Test, Ur: NEGATIVE

## 2015-11-03 MED ORDER — ONDANSETRON HCL 4 MG/2ML IJ SOLN
4.0000 mg | Freq: Once | INTRAMUSCULAR | Status: AC
  Administered 2015-11-03: 4 mg via INTRAVENOUS
  Filled 2015-11-03: qty 2

## 2015-11-03 MED ORDER — METOCLOPRAMIDE HCL 5 MG/ML IJ SOLN: 10.0000 mg | Freq: Once | INTRAMUSCULAR | Status: DC

## 2015-11-03 MED ORDER — KETOROLAC TROMETHAMINE 30 MG/ML IJ SOLN
30.0000 mg | Freq: Once | INTRAMUSCULAR | Status: AC
  Administered 2015-11-03: 30 mg via INTRAVENOUS
  Filled 2015-11-03: qty 1

## 2015-11-03 MED ORDER — ONDANSETRON 4 MG PO TBDP: 4.0000 mg | ORAL_TABLET | Freq: Three times a day (TID) | ORAL | Status: DC | PRN

## 2015-11-03 MED ORDER — SODIUM CHLORIDE 0.9 % IV SOLN: INTRAVENOUS | Status: DC

## 2015-11-03 MED ORDER — IOPAMIDOL (ISOVUE-300) INJECTION 61%
100.0000 mL | Freq: Once | INTRAVENOUS | Status: AC | PRN
  Administered 2015-11-03: 100 mL via INTRAVENOUS

## 2015-11-03 MED ORDER — NAPROXEN 500 MG PO TABS: 500.0000 mg | ORAL_TABLET | Freq: Two times a day (BID) | ORAL | Status: DC

## 2015-11-03 MED ORDER — MORPHINE SULFATE (PF) 4 MG/ML IV SOLN
4.0000 mg | Freq: Once | INTRAVENOUS | Status: AC
  Administered 2015-11-03: 4 mg via INTRAVENOUS
  Filled 2015-11-03: qty 1

## 2015-11-03 MED ORDER — OXYCODONE-ACETAMINOPHEN 5-325 MG PO TABS: 1.0000 | ORAL_TABLET | ORAL | Status: DC | PRN

## 2015-11-03 MED ORDER — SODIUM CHLORIDE 0.9 % IV BOLUS (SEPSIS)
1000.0000 mL | Freq: Once | INTRAVENOUS | Status: AC
  Administered 2015-11-03: 1000 mL via INTRAVENOUS

## 2015-11-03 MED FILL — OXYCODONE/APAP 5-325: 5-325 | 2 days supply | Qty: 6 | Fill #0

## 2015-11-03 MED FILL — NAPROXEN 500 MG TABLET: 500 | 15 days supply | Qty: 30 | Fill #0

## 2015-11-03 MED FILL — ONDANSETRON ODT 4 MG TABLET: 4 | 8 days supply | Qty: 20 | Fill #0

## 2015-11-03 NOTE — ED Notes (Signed)
Shawn, PA at bedside. 

## 2015-11-03 NOTE — ED Notes (Signed)
Patient transported to Ultrasound 

## 2015-11-03 NOTE — ED Provider Notes (Signed)
Complains of left lower quadrant abdominal pain onset several months ago became worse yesterday when she was working out at [employer], flexing her hips. She was seen by her primary care [facility] this past week, prescribed Flagyl for bacterial vaginosis. Started her menstrual cycle 2 days ago. Last bowel movement yesterday, normal. Denies any fever. Pain is worse with movement improved with remaining still. No nausea no vomiting. No other associated symptoms on exam alert no distress nontoxic-appearing abdomen nondistended normal active bowel sounds minimally tender at left lower quadrant no guarding rigidity or rebound  Doug SouSam Adonia Porada, MD 11/03/15 1022

## 2015-11-03 NOTE — Discharge Instructions (Signed)
You have been seen today for abdominal pain. Your imaging and lab tests showed no abnormalities. Despite this, you may still have ovarian cysts causing your pain. Follow up with your OB/GYN as soon as possible. Follow up with PCP as needed. Return to ED should symptoms worsen. Take 800 mg of ibuprofen every 8 hours or 500 mg of naproxen every 12 hours, for the next 3 days. Take these medications with food to avoid an upset stomach. Drink plenty of fluids to stay hydrated.

## 2015-11-03 NOTE — ED Notes (Signed)
Left lower abdominal pain- states pain became worse after doing abdominal workout yesterday- reports she has been having intermittent LLQ pain for several months- pain is worse with movement

## 2015-11-03 NOTE — ED Notes (Signed)
Patient returns from radiology department.

## 2015-11-03 NOTE — ED Provider Notes (Signed)
CSN: [id number]     Arrival date & time 11/03/15  0940 History   First MD Initiated Contact with Patient 11/03/15 [phone]     Chief Complaint  Patient presents with  . Abdominal Pain     (Consider location/radiation/quality/duration/timing/severity/associated sxs/prior Treatment) HPI    Lori Wolfe is a 35 y.o. female, with a history of pyelonephritis and BV, presenting to the ED with Lower left quadrant abdominal pain that worsens significantly over the past 24 hours. Patient states that she previously had a dull, intermittent ache over the past several months. Patient currently rates her pain at 4 or 5 out of 10, but increases to 8 out of 10 with palpation or movement. Patient states that during a workout yesterday, she felt like "something exploded" in her left lower quadrant and the pain has been increased since then. Patient denies that this feels like a sore muscle. Patient states she was seen by her OB/GYN for her annual exam on Monday, April 17. Patient states that her OB/GYN thought that the original pain may be coming from an ovarian cyst. Patient was also diagnosed with BV and is currently on day 3 of her Flagyl. Patient is sexually active with one partner beginning February 2017. Patient was prescribed low-dose OCP, which she has been directed to begin this coming Sunday. Patient is currently on day 3 of her menstrual cycle, but is at the tail end and states that she usually does not have abdominal discomfort at this time in the cycle. Last BM was yesterday and normal. Patient denies vomiting, diarrhea, hematochezia or melena, dysuria, or any other complaints.     Past Medical History  Diagnosis Date  . Pyelonephritis   . Urinary tract infection    Past Surgical History  Procedure Laterality Date  . No past surgeries    . Wisdom tooth extraction     Family History  Problem Relation Age of Onset  . Anesthesia problems Neg Hx    Social History  Substance Use Topics  .  Smoking status: Never Smoker   . Smokeless tobacco: Never Used  . Alcohol Use: Yes     Comment: occassional   OB History    Gravida Para Term Preterm AB TAB SAB Ectopic Multiple Living   3 3 3  0  0 0 0 0 3     Review of Systems  Constitutional: Negative for fever and chills.  Gastrointestinal: Positive for nausea, abdominal pain and blood in stool. Negative for vomiting, diarrhea and constipation.  Genitourinary: Positive for pelvic pain. Negative for dysuria, hematuria and vaginal pain.  Musculoskeletal: Negative for back pain.  Skin: Negative for color change and pallor.  All other systems reviewed and are negative.     Allergies  Review of patient's allergies indicates no known allergies.  Home Medications   Prior to Admission medications   Medication Sig Start Date End Date Taking? Authorizing Provider  metroNIDAZOLE (FLAGYL) 500 MG tablet Take 500 mg by mouth.   Yes Historical Provider, MD  cephALEXin (KEFLEX) 500 MG capsule Take 1 capsule (500 mg total) by mouth 4 (four) times daily. 01/14/14   Glynn OctaveStephen Rancour, MD  ciprofloxacin (CIPRO) 500 MG tablet Take 1 tablet (500 mg total) by mouth 2 (two) times daily. 11/09/14   Eyvonne MechanicJeffrey Hedges, PA-C  clotrimazole (LOTRIMIN) 1 % cream Apply 1 application topically 2 (two) times daily as needed. For itching/irritation    Historical Provider, MD  doxycycline (VIBRAMYCIN) 100 MG capsule Take 1 capsule (100 mg  total) by mouth 2 (two) times daily. 11/09/14   Eyvonne Mechanic, PA-C  HYDROcodone-acetaminophen (NORCO/VICODIN) 5-325 MG per tablet Take 2 tablets by mouth every 4 (four) hours as needed. 01/14/14   [name], MD  ibuprofen (ADVIL,MOTRIN) 400 MG tablet Take 400 mg by mouth every 6 (six) hours as needed.    Historical Provider, MD  Multiple Vitamin (MULTI VITAMIN DAILY PO) Take by mouth.    Historical Provider, MD  naproxen (NAPROSYN) 500 MG tablet Take 1 tablet (500 mg total) by mouth 2 (two) times daily. 11/03/15   Shawn C Joy, PA-C   ondansetron (ZOFRAN ODT) 4 MG disintegrating tablet Take 1 tablet (4 mg total) by mouth every 8 (eight) hours as needed for nausea or vomiting. 11/03/15   Shawn C Joy, PA-C  ondansetron (ZOFRAN) 4 MG tablet Take 1 tablet (4 mg total) by mouth every 6 (six) hours. 11/09/14   Eyvonne Mechanic, PA-C  oxyCODONE-acetaminophen (PERCOCET/ROXICET) 5-325 MG tablet Take 1 tablet by mouth every 4 (four) hours as needed for severe pain. 11/03/15   Anselm Pancoast, PA-C  Prenatal Vit-Fe Fumarate-FA (PRENATAL MULTIVITAMIN) TABS Take 1 tablet by mouth at bedtime.    Historical Provider, MD   BP 99/62 mmHg  Pulse 69  Temp(Src) 98.1 F (36.7 C) (Oral)  Resp 18  Ht 5' 4.5" (1.638 m)  Wt 54.885 kg  BMI 20.46 kg/m2  SpO2 100%  LMP 11/01/2015  Breastfeeding? No Physical Exam  Constitutional: She appears well-developed and well-nourished. No distress.  HENT:  Head: Normocephalic and atraumatic.  Eyes: Conjunctivae are normal. Pupils are equal, round, and reactive to light.  Neck: Neck supple.  Cardiovascular: Normal rate, regular rhythm, normal heart sounds and intact distal pulses.   Pulmonary/Chest: Effort normal and breath sounds normal. No respiratory distress.  Abdominal: Soft. Bowel sounds are normal. There is tenderness in the left lower quadrant. There is no guarding.  Genitourinary:  External genitalia normal Vagina without discharge - blood pooled in the vaginal vault consistent with menstruation. Cervix  normal negative for cervical motion tenderness Adnexa palpated, no masses or positive for tenderness noted on the left Bladder palpated negative for tenderness Uterus palpated no masses or positive for tenderness Otherwise normal female genitalia. Med Tech served as chaperone during exam.  Musculoskeletal: She exhibits no edema or tenderness.  Lymphadenopathy:    She has no cervical adenopathy.  Neurological: She is alert.  Skin: Skin is warm and dry. She is not diaphoretic.  Psychiatric: She  has a normal mood and affect. Her behavior is normal.  Nursing note and vitals reviewed.   ED Course  Pelvic exam Date/Time: 11/03/2015 11:00 AM Performed by: Anselm Pancoast Authorized by: Harolyn Rutherford C Consent: Verbal consent obtained. Risks and benefits: risks, benefits and alternatives were discussed Consent given by: patient Patient understanding: patient states understanding of the procedure being performed Patient consent: the patient's understanding of the procedure matches consent given Procedure consent: procedure consent matches procedure scheduled Patient identity confirmed: verbally with patient and arm band Local anesthesia used: no Patient sedated: no Patient tolerance: Patient tolerated the procedure well with no immediate complications   (including critical care time) Labs Review Labs Reviewed  WET PREP, GENITAL - Abnormal; Notable for the following:    WBC, Wet Prep HPF POC MODERATE (*)    All other components within normal limits  CBC WITH DIFFERENTIAL/PLATELET - Abnormal; Notable for the following:    RBC 3.82 (*)    Hemoglobin 11.4 (*)  HCT 34.0 (*)    All other components within normal limits  URINALYSIS, ROUTINE W REFLEX MICROSCOPIC (NOT AT [facility]) - Abnormal; Notable for the following:    Leukocytes, UA TRACE (*)    All other components within normal limits  COMPREHENSIVE METABOLIC PANEL - Abnormal; Notable for the following:    AST 48 (*)    Alkaline Phosphatase 29 (*)    All other components within normal limits  URINE MICROSCOPIC-ADD ON - Abnormal; Notable for the following:    Squamous Epithelial / LPF 0-5 (*)    Bacteria, UA RARE (*)    All other components within normal limits  PREGNANCY, URINE  RAPID HIV SCREEN (HIV 1/2 AB+AG)  RPR  GC/CHLAMYDIA PROBE AMP ([place]) NOT AT [facility]    Imaging Review US Transvaginal Non-ob  11/03/2015  ADDENDUM REPORT: 11/03/2015 12:07 ADDENDUM: Significant bowel gas within the LEFT lower quadrant obscured the  LEFT ovary. Findings conveyed toSHAWN JOY on 11/03/2015  at12:05. Electronically Signed   By: Genevive Bi M.D.   On: 11/03/2015 12:07  11/03/2015  CLINICAL DATA:  Sporadic LLQ pain x 2-3 months but worse since yesterday after working out at [employer] Started menses 2 days ago EXAM: TRANSABDOMINAL AND TRANSVAGINAL ULTRASOUND OF PELVIS DOPPLER ULTRASOUND OF OVARIES TECHNIQUE: Both transabdominal and transvaginal ultrasound examinations of the pelvis were performed. Transabdominal technique was performed for global imaging of the pelvis including uterus, ovaries, adnexal regions, and pelvic cul-de-sac. It was necessary to proceed with endovaginal exam following the transabdominal exam to visualize the ovaries. Color and duplex Doppler ultrasound was utilized to evaluate blood flow to the ovaries. COMPARISON:  None. FINDINGS: Uterus Measurements: Normal in size and echotexture measuring 10.4 x 4.5 x 5.8 cm. No fibroids or other mass visualized. Endometrium Thickness: Normal thickness for premenopausal female at 4 mm. No focal abnormality visualized. Right ovary Measurements: Normal in size at 2.8 x 2.1 x 2.0 cm. Normal follicles. Left ovary Measurements: Not identified by transabdominal or transvaginal ultrasound. Pulsed Doppler evaluation of RIGHT ovary demonstrates normal low-resistance arterial and venous waveforms. Other findings Trace free fluid. IMPRESSION: 1. Normal uterus. 2. Normal RIGHT ovary with normal vascular flow. 3. LEFT ovary not identified. Electronically Signed: By: Genevive Bi M.D. On: 11/03/2015 11:37   US Pelvis Complete  11/03/2015  ADDENDUM REPORT: 11/03/2015 12:07 ADDENDUM: Significant bowel gas within the LEFT lower quadrant obscured the LEFT ovary. Findings conveyed toSHAWN JOY on 11/03/2015  at12:05. Electronically Signed   By: Genevive Bi M.D.   On: 11/03/2015 12:07  11/03/2015  CLINICAL DATA:  Sporadic LLQ pain x 2-3 months but worse since yesterday after working out at [employer]. Started menses 2 days ago EXAM: TRANSABDOMINAL AND TRANSVAGINAL ULTRASOUND OF PELVIS DOPPLER ULTRASOUND OF OVARIES TECHNIQUE: Both transabdominal and transvaginal ultrasound examinations of the pelvis were performed. Transabdominal technique was performed for global imaging of the pelvis including uterus, ovaries, adnexal regions, and pelvic cul-de-sac. It was necessary to proceed with endovaginal exam following the transabdominal exam to visualize the ovaries. Color and duplex Doppler ultrasound was utilized to evaluate blood flow to the ovaries. COMPARISON:  None. FINDINGS: Uterus Measurements: Normal in size and echotexture measuring 10.4 x 4.5 x 5.8 cm. No fibroids or other mass visualized. Endometrium Thickness: Normal thickness for premenopausal female at 4 mm. No focal abnormality visualized. Right ovary Measurements: Normal in size at 2.8 x 2.1 x 2.0 cm. Normal follicles. Left ovary Measurements: Not identified by transabdominal or transvaginal ultrasound. Pulsed Doppler evaluation  of RIGHT ovary demonstrates normal low-resistance arterial and venous waveforms. Other findings Trace free fluid. IMPRESSION: 1. Normal uterus. 2. Normal RIGHT ovary with normal vascular flow. 3. LEFT ovary not identified. Electronically Signed: By: Genevive Bi M.D. On: 11/03/2015 11:37   Ct Abdomen Pelvis W Contrast  11/03/2015  CLINICAL DATA:  LEFT lower quadrant pain EXAM: CT ABDOMEN AND PELVIS WITH CONTRAST TECHNIQUE: Multidetector CT imaging of the abdomen and pelvis was performed using the standard protocol following bolus administration of intravenous contrast. CONTRAST:  ISOVUE-300 IOPAMIDOL (ISOVUE-300) INJECTION 61% COMPARISON:  Pelvic ultrasound 11/03/2015, CT 07/05/2013 FINDINGS: Lower chest: Lung bases are clear. Hepatobiliary: No focal hepatic lesion. No biliary duct dilatation. Gallbladder is normal. Common bile duct is normal. Pancreas: Pancreas is normal. No ductal dilatation. No pancreatic  inflammation. Spleen: Normal spleen Adrenals/urinary tract: Adrenal glands and kidneys are normal. The ureters and bladder normal. Stomach/Bowel: Stomach, small-bowel, appendix, cecum are normal. The colon and rectosigmoid colon are normal. Vascular/Lymphatic: Abdominal aorta is normal caliber. There is no retroperitoneal or periportal lymphadenopathy. No pelvic lymphadenopathy. Reproductive: Uterus is normal. The RIGHT ovary normal and seen on coronal image 38. The LEFT ovary is more difficult to define but appears to present on image 34, series 6 posterior to the uterine fundus. The LEFT ovary is not enlarged. Other: Trace free fluid the pelvis. Musculoskeletal: No aggressive osseous lesion. IMPRESSION: 1. No acute abdominal or pelvic findings. 2. Normal ovaries and uterus. 3. Normal appendix. 4. No obstructive uropathy. Electronically Signed   By: Genevive Bi M.D.   On: 11/03/2015 14:02   [place] Art/ven Flow Abd Pelv Doppler  11/03/2015  ADDENDUM REPORT: 11/03/2015 12:07 ADDENDUM: Significant bowel gas within the LEFT lower quadrant obscured the LEFT ovary. Findings conveyed toSHAWN JOY on 11/03/2015  at12:05. Electronically Signed   By: Genevive Bi M.D.   On: 11/03/2015 12:07  11/03/2015  CLINICAL DATA:  Sporadic LLQ pain x 2-3 months but worse since yesterday after working out at [employer] Started menses 2 days ago EXAM: TRANSABDOMINAL AND TRANSVAGINAL ULTRASOUND OF PELVIS DOPPLER ULTRASOUND OF OVARIES TECHNIQUE: Both transabdominal and transvaginal ultrasound examinations of the pelvis were performed. Transabdominal technique was performed for global imaging of the pelvis including uterus, ovaries, adnexal regions, and pelvic cul-de-sac. It was necessary to proceed with endovaginal exam following the transabdominal exam to visualize the ovaries. Color and duplex Doppler ultrasound was utilized to evaluate blood flow to the ovaries. COMPARISON:  None. FINDINGS: Uterus Measurements: Normal in size and  echotexture measuring 10.4 x 4.5 x 5.8 cm. No fibroids or other mass visualized. Endometrium Thickness: Normal thickness for premenopausal female at 4 mm. No focal abnormality visualized. Right ovary Measurements: Normal in size at 2.8 x 2.1 x 2.0 cm. Normal follicles. Left ovary Measurements: Not identified by transabdominal or transvaginal ultrasound. Pulsed Doppler evaluation of RIGHT ovary demonstrates normal low-resistance arterial and venous waveforms. Other findings Trace free fluid. IMPRESSION: 1. Normal uterus. 2. Normal RIGHT ovary with normal vascular flow. 3. LEFT ovary not identified. Electronically Signed: By: Genevive Bi M.D. On: 11/03/2015 11:37   I have personally reviewed and evaluated these images and lab results as part of my medical decision-making.   EKG Interpretation None       Medications  sodium chloride 0.9 % bolus 1,000 mL (0 mLs Intravenous Stopped 11/03/15 1140)  morphine 4 MG/ML injection 4 mg (4 mg Intravenous Given 11/03/15 1039)  ondansetron (ZOFRAN) injection 4 mg (4 mg Intravenous Given 11/03/15 1037)  ketorolac (TORADOL)  30 MG/ML injection 30 mg (30 mg Intravenous Given 11/03/15 1304)  iopamidol (ISOVUE-300) 61 % injection 100 mL (100 mLs Intravenous Contrast Given 11/03/15 1330)   Orders Placed This Encounter  Procedures  . Pelvic exam  . Wet prep, genital  . US Pelvis Complete  . US Transvaginal Non-OB  . [place] Art/Ven Flow Abd Pelv Doppler  . CT Abdomen Pelvis W Contrast  . CBC WITH DIFFERENTIAL  . Pregnancy, urine  . Urinalysis, Routine w reflex microscopic (not at [facility])  . Rapid HIV screen (HIV 1/2 Ab+Ag)  . Comprehensive metabolic panel  . Urine microscopic-add on  . Pelvic cart to bedside    MDM   Final diagnoses:  Abdominal pain, acute, left lower quadrant    Lori Wolfe presents with lower left quadrant abdominal pain that worsened yesterday.  Findings and plan of care discussed with Doug Sou, MD. Dr. Ethelda Chick personally  evaluated and examined this patient.  Patient's presentation is consistent with ruptured ovarian cyst, however, need to rule out ectopic pregnancy, torsion, or abscess. Patient is nontoxic appearing, afebrile, not tachycardic, not tachypneic, and is in no apparent distress. Patient has no signs of sepsis or other serious or life-threatening condition. Patient states that her mother is coming to the ED and will be driving her home. CMP over BMP obtained due to the patient's statement that her lab work on Monday was normal, except for her liver function tests. The impression on the pelvic ultrasound today states that the left ovary was not visualized. It was previously visualized and commented upon on pelvic CT in 2004. Radiologist consult directly. 11:59 AM Spoke with Dr. Amil Amen, radiologist who read the pelvic US, states that there was a lot of bowel and bowel gas that limited the view over the left ovary. Does not see any thing that would give suspicion for anything dangerous to the patient. Does not recommend any further imaging. Feels confident that things like torsion are still effectively ruled out. 12:14 PM Spoke with Gaylyn Rong, sonographer, who states that she was unable to visualize the left ovary, no matter what position she placed the patient in. Patient's abdominal and pelvic CT showed no abnormalities, specifically no abnormalities with the left ovary. Dangerous and life-threatening conditions have effectively been ruled out. The source of the patient's discomfort may be muscular. Patient advised to follow-up with her OB/GYN should symptoms continue. Return precautions discussed. Patient voiced understanding of these instructions, accepts the plan, and is comfortable with discharge.   Filed Vitals:   11/03/15 0947 11/03/15 1226 11/03/15 1429  BP: 107/59 110/62 99/62  Pulse: 68 63 69  Temp: 98.7 F (37.1 C) 98.1 F (36.7 C)   TempSrc: Oral Oral   Resp: 16 16 18   Height: 5' 4.5" (1.638 m)     Weight: 54.885 kg    SpO2: 100% 100% 100%     Anselm Pancoast, PA-C 11/04/15 0940  Doug Sou, MD 11/04/15 1705  Doug Sou, MD 11/04/15 1705

## 2015-11-04 LAB — GC/CHLAMYDIA PROBE AMP (~~LOC~~) NOT AT ARMC
CHLAMYDIA, DNA PROBE: NEGATIVE
NEISSERIA GONORRHEA: NEGATIVE

## 2015-11-04 LAB — RPR: RPR Ser Ql: NONREACTIVE

## 2016-10-24 ENCOUNTER — Encounter (HOSPITAL_COMMUNITY): Payer: Self-pay

## 2016-10-24 ENCOUNTER — Inpatient Hospital Stay (HOSPITAL_COMMUNITY): Payer: Self-pay

## 2016-10-24 ENCOUNTER — Inpatient Hospital Stay (HOSPITAL_COMMUNITY)
Admission: AD | Admit: 2016-10-24 | Discharge: 2016-10-24 | Disposition: A | Discharge status: Home / Self Care | Payer: Self-pay | Source: Ambulatory Visit | Attending: Obstetrics & Gynecology | Admitting: Obstetrics & Gynecology

## 2016-10-24 DIAGNOSIS — O26891 Other specified pregnancy related conditions, first trimester: Secondary | ICD-10-CM | POA: Insufficient documentation

## 2016-10-24 DIAGNOSIS — O3680X0 Pregnancy with inconclusive fetal viability, not applicable or unspecified: Secondary | ICD-10-CM | POA: Insufficient documentation

## 2016-10-24 DIAGNOSIS — N760 Acute vaginitis: Secondary | ICD-10-CM | POA: Insufficient documentation

## 2016-10-24 DIAGNOSIS — O209 Hemorrhage in early pregnancy, unspecified: Secondary | ICD-10-CM | POA: Insufficient documentation

## 2016-10-24 DIAGNOSIS — Z3490 Encounter for supervision of normal pregnancy, unspecified, unspecified trimester: Secondary | ICD-10-CM

## 2016-10-24 DIAGNOSIS — B9689 Other specified bacterial agents as the cause of diseases classified elsewhere: Secondary | ICD-10-CM | POA: Insufficient documentation

## 2016-10-24 DIAGNOSIS — Z79899 Other long term (current) drug therapy: Secondary | ICD-10-CM | POA: Insufficient documentation

## 2016-10-24 DIAGNOSIS — Z3A01 Less than 8 weeks gestation of pregnancy: Secondary | ICD-10-CM | POA: Insufficient documentation

## 2016-10-24 DIAGNOSIS — R109 Unspecified abdominal pain: Secondary | ICD-10-CM | POA: Insufficient documentation

## 2016-10-24 LAB — URINALYSIS, ROUTINE W REFLEX MICROSCOPIC
Bilirubin Urine: NEGATIVE
Glucose, UA: NEGATIVE mg/dL
Hgb urine dipstick: NEGATIVE
Ketones, ur: NEGATIVE mg/dL
LEUKOCYTES UA: NEGATIVE
NITRITE: NEGATIVE
PH: 5 (ref 5.0–8.0)
Protein, ur: NEGATIVE mg/dL
Specific Gravity, Urine: 1.028 (ref 1.005–1.030)

## 2016-10-24 LAB — CBC
HCT: 35.7 % — ABNORMAL LOW (ref 36.0–46.0)
Hemoglobin: 12.2 g/dL (ref 12.0–15.0)
MCH: 29.6 pg (ref 26.0–34.0)
MCHC: 34.2 g/dL (ref 30.0–36.0)
MCV: 86.7 fL (ref 78.0–100.0)
PLATELETS: 229 10*3/uL (ref 150–400)
RBC: 4.12 MIL/uL (ref 3.87–5.11)
RDW: 12.5 % (ref 11.5–15.5)
WBC: 8.4 10*3/uL (ref 4.0–10.5)

## 2016-10-24 LAB — HCG, QUANTITATIVE, PREGNANCY: HCG, BETA CHAIN, QUANT, S: 43 m[IU]/mL — AB (ref <5)

## 2016-10-24 LAB — WET PREP, GENITAL
Sperm: NONE SEEN
Trich, Wet Prep: NONE SEEN
YEAST WET PREP: NONE SEEN

## 2016-10-24 LAB — POCT PREGNANCY, URINE: Preg Test, Ur: POSITIVE — AB

## 2016-10-24 MED ORDER — CONCEPT OB 130-92.4-1 MG PO CAPS: 1.0000 | ORAL_CAPSULE | Freq: Every day | ORAL | 12 refills | Status: DC

## 2016-10-24 MED ORDER — METRONIDAZOLE 500 MG PO TABS: 500.0000 mg | ORAL_TABLET | Freq: Two times a day (BID) | ORAL | 0 refills | Status: DC

## 2016-10-24 NOTE — Discharge Instructions (Signed)
Abdominal Pain During Pregnancy °Abdominal pain is common in pregnancy. Most of the time, it does not cause harm. There are many causes of abdominal pain. Some causes are more serious than others and sometimes the cause is not known. Abdominal pain can be a sign that something is very wrong with the pregnancy or the pain may have nothing to do with the pregnancy. Always tell your health care provider if you have any abdominal pain. °Follow these instructions at home: °· Do not have sex or put anything in your vagina until your symptoms go away completely. °· Watch your abdominal pain for any changes. °· Get plenty of rest until your pain improves. °· Drink enough fluid to keep your urine clear or pale yellow. °· Take over-the-counter or prescription medicines only as told by your health care provider. °· Keep all follow-up visits as told by your health care provider. This is important. °Contact a health care provider if: °· You have a fever. °· Your pain gets worse or you have cramping. °· Your pain continues after resting. °Get help right away if: °· You are bleeding, leaking fluid, or passing tissue from the vagina. °· You have vomiting or diarrhea that does not go away. °· You have painful or bloody urination. °· You notice a decrease in your baby's movements. °· You feel very weak or faint. °· You have shortness of breath. °· You develop a severe headache with abdominal pain. °· You have abnormal vaginal discharge with abdominal pain. °This information is not intended to replace advice given to you by your health care provider. Make sure you discuss any questions you have with your health care provider. °Document Released: 07/02/2005 Document Revised: 04/12/2016 Document Reviewed: 01/29/2013 °Elsevier Interactive Patient Education © 2017 [employer] ° ° °Bacterial Vaginosis °Bacterial vaginosis is a vaginal infection that occurs when the normal balance of bacteria in the vagina is disrupted. It results from an  overgrowth of certain bacteria. This is the most common vaginal infection among women ages 15-44. °Because bacterial vaginosis increases your risk for STIs (sexually transmitted infections), getting treated can help reduce your risk for chlamydia, gonorrhea, herpes, and HIV (human immunodeficiency virus). Treatment is also important for preventing complications in pregnant women, because this condition can cause an early (premature) delivery. °What are the causes? °This condition is caused by an increase in harmful bacteria that are normally present in small amounts in the vagina. However, the reason that the condition develops is not fully understood. °What increases the risk? °The following factors may make you more likely to develop this condition: °· Having a new sexual partner or multiple sexual partners. °· Having unprotected sex. °· Douching. °· Having an intrauterine device (IUD). °· Smoking. °· Drug and alcohol abuse. °· Taking certain antibiotic medicines. °· Being pregnant. °You cannot get bacterial vaginosis from toilet seats, bedding, swimming pools, or contact with objects around you. °What are the signs or symptoms? °Symptoms of this condition include: °· Grey or white vaginal discharge. The discharge can also be watery or foamy. °· A fish-like odor with discharge, especially after sexual intercourse or during menstruation. °· Itching in and around the vagina. °· Burning or pain with urination. °Some women with bacterial vaginosis have no signs or symptoms. °How is this diagnosed? °This condition is diagnosed based on: °· Your medical history. °· A physical exam of the vagina. °· Testing a sample of vaginal fluid under a microscope to look for a large amount of bad bacteria or   abnormal cells. Your health care provider may use a cotton swab or a small wooden spatula to collect the sample. °How is this treated? °This condition is treated with antibiotics. These may be given as a pill, a vaginal cream,  or a medicine that is put into the vagina (suppository). If the condition comes back after treatment, a second round of antibiotics may be needed. °Follow these instructions at home: °Medicines °· Take over-the-counter and prescription medicines only as told by your health care provider. °· Take or use your antibiotic as told by your health care provider. Do not stop taking or using the antibiotic even if you start to feel better. °General instructions °· If you have a female sexual partner, tell her that you have a vaginal infection. She should see her health care provider and be treated if she has symptoms. If you have a female sexual partner, he does not need treatment. °· During treatment: °¨ Avoid sexual activity until you finish treatment. °¨ Do not douche. °¨ Avoid alcohol as directed by your health care provider. °¨ Avoid breastfeeding as directed by your health care provider. °· Drink enough water and fluids to keep your urine clear or pale yellow. °· Keep the area around your vagina and rectum clean. °¨ Wash the area daily with warm water. °¨ Wipe yourself from front to back after using the toilet. °· Keep all follow-up visits as told by your health care provider. This is important. °How is this prevented? °· Do not douche. °· Wash the outside of your vagina with warm water only. °· Use protection when having sex. This includes latex condoms and dental dams. °· Limit how many sexual partners you have. To help prevent bacterial vaginosis, it is best to have sex with just one partner (monogamous). °· Make sure you and your sexual partner are tested for STIs. °· Wear cotton or cotton-lined underwear. °· Avoid wearing tight pants and pantyhose, especially during summer. °· Limit the amount of alcohol that you drink. °· Do not use any products that contain nicotine or tobacco, such as cigarettes and e-cigarettes. If you need help quitting, ask your health care provider. °· Do not use illegal drugs. °Where to find  more information: °· Centers for Disease Control and Prevention: www.cdc.gov/std °· American Sexual Health Association (ASHA): www.ashastd.org °· U.S. Department of Health and Human Services, Office on Women's Health: www.womenshealth.gov/ or https://www.womenshealth.gov/a-z-topics/bacterial-vaginosis °Contact a health care provider if: °· Your symptoms do not improve, even after treatment. °· You have more discharge or pain when urinating. °· You have a fever. °· You have pain in your abdomen. °· You have pain during sex. °· You have vaginal bleeding between periods. °Summary °· Bacterial vaginosis is a vaginal infection that occurs when the normal balance of bacteria in the vagina is disrupted. °· Because bacterial vaginosis increases your risk for STIs (sexually transmitted infections), getting treated can help reduce your risk for chlamydia, gonorrhea, herpes, and HIV (human immunodeficiency virus). Treatment is also important for preventing complications in pregnant women, because the condition can cause an early (premature) delivery. °· This condition is treated with antibiotic medicines. These may be given as a pill, a vaginal cream, or a medicine that is put into the vagina (suppository). °This information is not intended to replace advice given to you by your health care provider. Make sure you discuss any questions you have with your health care provider. °Document Released: 07/02/2005 Document Revised: 03/17/2016 Document Reviewed: 03/17/2016 °Elsevier Interactive Patient Education ©   2017 [employer] Inc. ° °

## 2016-10-24 NOTE — MAU Provider Note (Signed)
Chief Complaint: Pelvic Pain   First Provider Initiated Contact with Patient 10/24/16 2228     SUBJECTIVE HPI: Lori Wolfe is a 36 y.o. G4P3003 at [redacted]w[redacted]d who presents to Maternity Admissions reporting LLQ pain and faint spotting x a few weeks. Pos home UPT. States if feels liek when she had an ovarian cyst in the past.  Vaginal Bleeding: Faint pink. None now. Passage of tissue or clots: None Dizziness: None  O NEG  Pain Location: LLQ Quality: Sharp Severity: 2/10 on pain scale Duration: few weeks Course: unchanged Context: early pregnancy Timing: intermittent Modifying factors: Partial relief w/ One tylenol or Ibuprofen Associated signs and symptoms: Pos for spotting. Neg for fever, chills, GI complaints, urinary complaints.  Past Medical History:  Diagnosis Date  . Pyelonephritis   . Urinary tract infection    OB History  Gravida Para Term Preterm AB Living  0   3  SAB TAB Ectopic Multiple Live Births  0 0 0 0 3    # Outcome Date GA Lbr Len/2nd Weight Sex Delivery Anes PTL Lv  4 Current           3 Term 11/05/11 [redacted]w[redacted]d 02:49 / 00:35 6 lb 4 oz (2.835 kg) F Vag-Spont EPI  LIV     Birth Comments: WNL  2 Term      Vag-Spont   LIV  1 Term      Vag-Spont   LIV     Past Surgical History:  Procedure Laterality Date  . NO PAST SURGERIES    . WISDOM TOOTH EXTRACTION     Social History   Social History  . Marital status: Single    Spouse name: N/A  . Number of children: N/A  . Years of education: N/A   Occupational History  . Not on file.   Social History Main Topics  . Smoking status: Never Smoker  . Smokeless tobacco: Never Used  . Alcohol use Yes     Comment: occassional  . Drug use: No  . Sexual activity: Yes    Birth control/ protection: None   Other Topics Concern  . Not on file   Social History Narrative  . No narrative on file   No current facility-administered medications on file prior to encounter.    Current Outpatient Prescriptions  on File Prior to Encounter  Medication Sig Dispense Refill  . cephALEXin (KEFLEX) 500 MG capsule Take 1 capsule (500 mg total) by mouth 4 (four) times daily. 40 capsule 0  . ciprofloxacin (CIPRO) 500 MG tablet Take 1 tablet (500 mg total) by mouth 2 (two) times daily. 14 tablet 0  . clotrimazole (LOTRIMIN) 1 % cream Apply 1 application topically 2 (two) times daily as needed. For itching/irritation    . doxycycline (VIBRAMYCIN) 100 MG capsule Take 1 capsule (100 mg total) by mouth 2 (two) times daily. 28 capsule 0  . HYDROcodone-acetaminophen (NORCO/VICODIN) 5-325 MG per tablet Take 2 tablets by mouth every 4 (four) hours as needed. 10 tablet 0  . ibuprofen (ADVIL,MOTRIN) 400 MG tablet Take 400 mg by mouth every 6 (six) hours as needed.    . metroNIDAZOLE (FLAGYL) 500 MG tablet Take 500 mg by mouth.    . Multiple Vitamin (MULTI VITAMIN DAILY PO) Take by mouth.    . naproxen (NAPROSYN) 500 MG tablet Take 1 tablet (500 mg total) by mouth 2 (two) times daily. 30 tablet 0  . ondansetron (ZOFRAN ODT) 4 MG disintegrating tablet Take 1 tablet (  4 mg total) by mouth every 8 (eight) hours as needed for nausea or vomiting. 20 tablet 0  . ondansetron (ZOFRAN) 4 MG tablet Take 1 tablet (4 mg total) by mouth every 6 (six) hours. 12 tablet 0  . oxyCODONE-acetaminophen (PERCOCET/ROXICET) 5-325 MG tablet Take 1 tablet by mouth every 4 (four) hours as needed for severe pain. 6 tablet 0  . Prenatal Vit-Fe Fumarate-FA (PRENATAL MULTIVITAMIN) TABS Take 1 tablet by mouth at bedtime.     No Known Allergies  I have reviewed the past Medical Hx, Surgical Hx, Social Hx, Allergies and Medications.   Review of Systems  Constitutional: Negative for appetite change, chills and fever.  Gastrointestinal: Positive for abdominal pain. Negative for abdominal distention, constipation, diarrhea, nausea and vomiting.  Genitourinary: Negative for dysuria, vaginal bleeding and vaginal discharge.  Musculoskeletal: Negative for back  pain.    OBJECTIVE Patient Vitals for the past 24 hrs:  BP Temp Temp src Pulse Resp SpO2 Height Weight  10/24/16 1931 104/71 99.6 F (37.6 C) Oral 86 17 100 % 5' 4.5" (1.638 m) 124 lb (56.2 kg)   Constitutional: Well-developed, well-nourished female in no acute distress.  Cardiovascular: normal rate Respiratory: normal rate and effort.  GI: Abd soft, non-tender, neg mass. Pos BS x 4 MS: Extremities nontender, no edema, normal ROM Neurologic: Alert and oriented x 4.  GU: Neg CVAT.  SPECULUM EXAM: NEFG, physiologic discharge, no blood noted, cervix clean  BIMANUAL: cervix long and closed; uterus not obviously enlarged, no adnexal tenderness or masses. No CMT.  LAB RESULTS Results for orders placed or performed during the hospital encounter of 10/24/16 (from the past 24 hour(s))  Urinalysis, Routine w reflex microscopic     Status: None   Collection Time: 10/24/16  7:35 PM  Result Value Ref Range   Color, Urine YELLOW YELLOW   APPearance CLEAR CLEAR   Specific Gravity, Urine 1.028 1.005 - 1.030   pH 5.0 5.0 - 8.0   Glucose, UA NEGATIVE NEGATIVE mg/dL   Hgb urine dipstick NEGATIVE NEGATIVE   Bilirubin Urine NEGATIVE NEGATIVE   Ketones, ur NEGATIVE NEGATIVE mg/dL   Protein, ur NEGATIVE NEGATIVE mg/dL   Nitrite NEGATIVE NEGATIVE   Leukocytes, UA NEGATIVE NEGATIVE  Pregnancy, urine POC     Status: Abnormal   Collection Time: 10/24/16  7:47 PM  Result Value Ref Range   Preg Test, Ur POSITIVE (A) NEGATIVE  CBC     Status: Abnormal   Collection Time: 10/24/16  7:51 PM  Result Value Ref Range   WBC 8.4 4.0 - 10.5 K/uL   RBC 4.12 3.87 - 5.11 MIL/uL   Hemoglobin 12.2 12.0 - 15.0 g/dL   HCT 16.1 (L) 09.6 - 04.5 %   MCV 86.7 78.0 - 100.0 fL   MCH 29.6 26.0 - 34.0 pg   MCHC 34.2 30.0 - 36.0 g/dL   RDW 40.9 81.1 - 91.4 %   Platelets 229 150 - 400 K/uL  hCG, quantitative, pregnancy     Status: Abnormal   Collection Time: 10/24/16  7:51 PM  Result Value Ref Range   hCG, Beta Chain,  Quant, S 43 (H) <5 mIU/mL  Wet prep, genital     Status: Abnormal   Collection Time: 10/24/16 10:30 PM  Result Value Ref Range   Yeast Wet Prep HPF POC NONE SEEN NONE SEEN   Trich, Wet Prep NONE SEEN NONE SEEN   Clue Cells Wet Prep HPF POC PRESENT (A) NONE SEEN   WBC, Wet Prep  HPF POC MANY (A) NONE SEEN   Sperm NONE SEEN     IMAGING US Ob Comp Less 14 Wks  Result Date: 10/24/2016 CLINICAL DATA:  Initial evaluation for sharp intermittent left lower quadrant abdominal pain. Beta HCG equals 43. Gestational age by LMP equals 3 weeks and 1 day. EXAM: OBSTETRIC <14 WK [place] AND TRANSVAGINAL OB US TECHNIQUE: Both transabdominal and transvaginal ultrasound examinations were performed for complete evaluation of the gestation as well as the maternal uterus, adnexal regions, and pelvic cul-de-sac. Transvaginal technique was performed to assess early pregnancy. COMPARISON:  None available. FINDINGS: Intrauterine gestational sac: None visualized. Trace fluid within the endometrial canal without discernible decidual reaction at this time. Yolk sac:  None visualized. Embryo:  None visualized. Cardiac Activity: N/A Heart Rate: N/A  bpm Subchorionic hemorrhage:  None visualized. Maternal uterus/adnexae: Ovaries are well visualized within the adnexa bilaterally. Right ovary measured 2.5 x 2.2 x 1.7 cm and is normal in appearance. Left ovary measured 3.8 x 2.5 x 3.1 cm and is also normal in appearance. A mildly complex corpus luteal cyst is noted within the left ovary. No free pelvic fluid. IMPRESSION: 1. No discernible intrauterine pregnancy identified. This constitutes as a pregnancy of unknown location at this time. Close clinical monitoring with serial beta HCGs and close interval follow-up ultrasound recommended as clinically warranted. 2. Left ovarian corpus luteal cyst. 3. Otherwise unremarkable pelvic ultrasound.  No free fluid. Electronically Signed   By: Rise Mu M.D.   On: 10/24/2016 21:12   US Ob  Transvaginal  Result Date: 10/24/2016 CLINICAL DATA:  Initial evaluation for sharp intermittent left lower quadrant abdominal pain. Beta HCG equals 43. Gestational age by LMP equals 3 weeks and 1 day. EXAM: OBSTETRIC <14 WK [place] AND TRANSVAGINAL OB US TECHNIQUE: Both transabdominal and transvaginal ultrasound examinations were performed for complete evaluation of the gestation as well as the maternal uterus, adnexal regions, and pelvic cul-de-sac. Transvaginal technique was performed to assess early pregnancy. COMPARISON:  None available. FINDINGS: Intrauterine gestational sac: None visualized. Trace fluid within the endometrial canal without discernible decidual reaction at this time. Yolk sac:  None visualized. Embryo:  None visualized. Cardiac Activity: N/A Heart Rate: N/A  bpm Subchorionic hemorrhage:  None visualized. Maternal uterus/adnexae: Ovaries are well visualized within the adnexa bilaterally. Right ovary measured 2.5 x 2.2 x 1.7 cm and is normal in appearance. Left ovary measured 3.8 x 2.5 x 3.1 cm and is also normal in appearance. A mildly complex corpus luteal cyst is noted within the left ovary. No free pelvic fluid. IMPRESSION: 1. No discernible intrauterine pregnancy identified. This constitutes as a pregnancy of unknown location at this time. Close clinical monitoring with serial beta HCGs and close interval follow-up ultrasound recommended as clinically warranted. 2. Left ovarian corpus luteal cyst. 3. Otherwise unremarkable pelvic ultrasound.  No free fluid. Electronically Signed   By: Rise Mu M.D.   On: 10/24/2016 21:12    MAU COURSE CBC, Quant, ABO/Rh, ultrasound, wet prep and GC/chlamydia culture, UA  MDM - Pain and scant spotting (None on exam) in early pregnancy with pregnancy of unknown anatomic location, but hemodynamically stable. CLC and BV may be causing Sx.  - Rh neg. Discussed w/ Dr. Despina Hidden. Rhophylac not indicated this early/low quant hCG. Discussed w/ pt. Agrees  and declines Rhophylac.  ASSESSMENT 1. Pregnancy of unknown anatomic location   2. Abdominal pain during pregnancy, first trimester   3. Bacterial vaginosis     PLAN Discharge home in stable  condition. Ectopic precautions. Unable to come in 48 hours this weekend for repeat quant. Requesting to come Monday 4/16. Not ideal. Encouraged to return to MAU if Sx worsen.  Follow-up Information    [facility] [place] [facility] ADMISS[facility] Follow up on 10/27/2016.   Why:  For repeat bloodwork or sooner as needed if symptoms worsen Contact information: [address] [id number] mc [place] [address] [phone]         Allergies as of 10/24/2016   No Known Allergies     Medication List    STOP taking these medications   cephALEXin 500 MG capsule Commonly known as:  KEFLEX   ciprofloxacin 500 MG tablet Commonly known as:  CIPRO   clotrimazole 1 % cream Commonly known as:  LOTRIMIN   doxycycline 100 MG capsule Commonly known as:  VIBRAMYCIN   HYDROcodone-acetaminophen 5-325 MG tablet Commonly known as:  NORCO/VICODIN   ibuprofen 400 MG tablet Commonly known as:  ADVIL,MOTRIN   MULTI VITAMIN DAILY PO   naproxen 500 MG tablet Commonly known as:  NAPROSYN     TAKE these medications   CONCEPT OB 130-92.4-1 MG Caps Take 1 tablet by mouth daily.   metroNIDAZOLE 500 MG tablet Commonly known as:  FLAGYL Take 1 tablet (500 mg total) by mouth 2 (two) times daily. What changed:  when to take this     ASK your doctor about these medications   ondansetron 4 MG disintegrating tablet Commonly known as:  ZOFRAN ODT Take 1 tablet (4 mg total) by mouth every 8 (eight) hours as needed for nausea or vomiting.   ondansetron 4 MG tablet Commonly known as:  ZOFRAN Take 1 tablet (4 mg total) by mouth every 6 (six) hours.   oxyCODONE-acetaminophen 5-325 MG tablet Commonly known as:  PERCOCET/ROXICET Take 1 tablet by mouth every 4 (four) hours  as needed for severe pain.   prenatal multivitamin Tabs tablet Take 1 tablet by mouth at bedtime.       [place], CNM 10/24/2016  11:24 PM  4

## 2016-10-24 NOTE — MAU Note (Signed)
Pt c/o left lower abdominal pain-states pain is intermittent and sharp. Rates 7/10. Has taken ibuprofen and tylenol-states it helps some. Has a hx of ovarian cysts and unsure if pain is related to that.. Pt also states she also had a +upt at home. LMP: 10/02/16. Some light vaginal spotting.

## 2016-10-25 LAB — GC/CHLAMYDIA PROBE AMP (~~LOC~~) NOT AT ARMC
CHLAMYDIA, DNA PROBE: NEGATIVE
Neisseria Gonorrhea: NEGATIVE

## 2016-10-25 LAB — HIV ANTIBODY (ROUTINE TESTING W REFLEX): HIV Screen 4th Generation wRfx: NONREACTIVE

## 2017-02-11 ENCOUNTER — Emergency Department (HOSPITAL_BASED_OUTPATIENT_CLINIC_OR_DEPARTMENT_OTHER)
Admission: EM | Admit: 2017-02-11 | Discharge: 2017-02-11 | Disposition: A | Discharge status: Home / Self Care | Payer: Self-pay | Attending: Emergency Medicine | Admitting: Emergency Medicine

## 2017-02-11 ENCOUNTER — Encounter (HOSPITAL_BASED_OUTPATIENT_CLINIC_OR_DEPARTMENT_OTHER): Payer: Self-pay

## 2017-02-11 DIAGNOSIS — N3000 Acute cystitis without hematuria: Secondary | ICD-10-CM | POA: Insufficient documentation

## 2017-02-11 DIAGNOSIS — Z79899 Other long term (current) drug therapy: Secondary | ICD-10-CM | POA: Insufficient documentation

## 2017-02-11 HISTORY — DX: Unspecified ovarian cyst, unspecified side: N83.209

## 2017-02-11 LAB — URINALYSIS, MICROSCOPIC (REFLEX)

## 2017-02-11 LAB — URINALYSIS, ROUTINE W REFLEX MICROSCOPIC
Bilirubin Urine: NEGATIVE
Glucose, UA: NEGATIVE mg/dL
Ketones, ur: 15 mg/dL — AB
NITRITE: NEGATIVE
PH: 5.5 (ref 5.0–8.0)
Protein, ur: NEGATIVE mg/dL
SPECIFIC GRAVITY, URINE: 1.025 (ref 1.005–1.030)

## 2017-02-11 LAB — PREGNANCY, URINE: PREG TEST UR: NEGATIVE

## 2017-02-11 MED ORDER — CEPHALEXIN 500 MG PO CAPS: 500.0000 mg | ORAL_CAPSULE | Freq: Four times a day (QID) | ORAL | 0 refills | Status: DC

## 2017-02-11 MED ORDER — CEPHALEXIN 250 MG PO CAPS
500.0000 mg | ORAL_CAPSULE | Freq: Once | ORAL | Status: AC
  Administered 2017-02-11: 500 mg via ORAL
  Filled 2017-02-11: qty 2

## 2017-02-11 MED FILL — CEPHALEXIN 500 MG CAPSULE: 500 | 7 days supply | Qty: 28 | Fill #0

## 2017-02-11 NOTE — ED Triage Notes (Signed)
Pt c/o bilateral flank pain R>L, burning/pain with urination, and frequency x5 days.

## 2017-02-11 NOTE — Discharge Instructions (Signed)
Take the antibiotic Keflex as directed with expect improvement over the next 2 days. Return if not improving or for new or worse symptoms. Work note provided.

## 2017-02-11 NOTE — ED Provider Notes (Signed)
[facility] Provider Note   CSN: [id number] Arrival date & time: 02/11/17  1526 By signing my name below, I, Levon HedgerElizabeth Hall, attest that this documentation has been prepared under the direction and in the presence of Vanetta MuldersZackowski, Opal Mckellips, MD . Electronically Signed: Levon HedgerElizabeth Hall, Scribe. 02/11/2017. 4:17 PM.   History   Chief Complaint Chief Complaint  Patient presents with  . Urinary Frequency    HPI Lori Wolfe is a 36 y.o. female with a history of UTI, pylonephritis, and ovarian cysts who presents to the Emergency Department complaining of increased urinary frequency onset five days ago. She reports associated bilateral flank pain (R>L) onset yesterday, suprapubic pain and dysuria. She has taken cranberry supplements with no relief of symptoms. Pt states this feels similar prior episodes of urinary tract infections. Pt denies any fever, chills, rhinorrhea, sore throat, cough, visual disturbance, SOB, CP, n/v/d, hematuria, joint swelling, or headaches.    The history is provided by the patient. No language interpreter was used.  Urinary Frequency  This is a new problem. The current episode started more than 2 days ago. The problem has not changed since onset.Associated symptoms include abdominal pain. Pertinent negatives include no chest pain, no headaches and no shortness of breath. Nothing aggravates the symptoms. Nothing relieves the symptoms. The treatment provided no relief.    Past Medical History:  Diagnosis Date  . Ovarian cyst   . Pyelonephritis   . Urinary tract infection     Patient Active Problem List   Diagnosis Date Noted  . Active labor 11/05/2011    Past Surgical History:  Procedure Laterality Date  . NO PAST SURGERIES    . WISDOM TOOTH EXTRACTION      OB History    Gravida Para Term Preterm AB Living   4 3 3  0   3   SAB TAB Ectopic Multiple Live Births   0 0 0 0 3       Home Medications    Prior to Admission medications   Medication  Sig Start Date End Date Taking? Authorizing Provider  cephALEXin (KEFLEX) 500 MG capsule Take 1 capsule (500 mg total) by mouth 4 (four) times daily. 02/11/17   Vanetta MuldersZackowski, Emmersyn Kratzke, MD  metroNIDAZOLE (FLAGYL) 500 MG tablet Take 1 tablet (500 mg total) by mouth 2 (two) times daily. 10/24/16   Katrinka BlazingSmith, [place], CNM  ondansetron (ZOFRAN ODT) 4 MG disintegrating tablet Take 1 tablet (4 mg total) by mouth every 8 (eight) hours as needed for nausea or vomiting. 11/03/15   Joy, Shawn C, PA-C  ondansetron (ZOFRAN) 4 MG tablet Take 1 tablet (4 mg total) by mouth every 6 (six) hours. 11/09/14   Hedges, Tinnie GensJeffrey, PA-C  oxyCODONE-acetaminophen (PERCOCET/ROXICET) 5-325 MG tablet Take 1 tablet by mouth every 4 (four) hours as needed for severe pain. 11/03/15   Joy, Shawn C, PA-C  Prenat w/o A Vit-FeFum-FePo-FA (CONCEPT OB) 130-92.4-1 MG CAPS Take 1 tablet by mouth daily. 10/24/16   Katrinka BlazingSmith, [place], CNM  Prenatal Vit-Fe Fumarate-FA (PRENATAL MULTIVITAMIN) TABS Take 1 tablet by mouth at bedtime.    [provider]    Family History Family History  Problem Relation Age of Onset  . Anesthesia problems Neg Hx     Social History Social History  Substance Use Topics  . Smoking status: Never Smoker  . Smokeless tobacco: Never Used  . Alcohol use Yes     Comment: occassional     Allergies   Patient has no known allergies.   Review  of Systems Review of Systems  Constitutional: Negative for chills and fever.  HENT: Negative for rhinorrhea, sneezing and sore throat.   Eyes: Negative for visual disturbance.  Respiratory: Negative for cough and shortness of breath.   Cardiovascular: Negative for chest pain.  Gastrointestinal: Positive for abdominal pain. Negative for diarrhea, nausea and vomiting.  Genitourinary: Positive for dysuria, flank pain and frequency. Negative for hematuria.  Musculoskeletal: Positive for back pain. Negative for joint swelling.  Skin: Negative for rash.  Neurological: Negative  for headaches.  Hematological: Does not bruise/bleed easily.  Psychiatric/Behavioral: Negative for confusion.   Physical Exam Updated Vital Signs BP (!) 112/54 (BP Location: Left Arm)   Pulse 87   Temp 99 F (37.2 C) (Oral)   Resp 16   Ht 1.626 m (5\' 4" )   Wt 58.1 kg (128 lb)   LMP 01/15/2017   SpO2 100%   Breastfeeding? Unknown   BMI 21.97 kg/m   Physical Exam  Constitutional: She is oriented to person, place, and time. She appears well-developed and well-nourished. No distress.  HENT:  Head: Normocephalic.  Mucous membranes are moist.   Eyes: Pupils are equal, round, and reactive to light. Conjunctivae and EOM are normal. No scleral icterus.  Neck: Neck supple.  Cardiovascular: Normal rate and regular rhythm.   Pulmonary/Chest: Effort normal. No respiratory distress. She has no wheezes. She has no rales.  Abdominal: Soft. Bowel sounds are normal. There is tenderness.  Suprapubic tenderness, no CVA tenderness  Musculoskeletal: Normal range of motion. She exhibits no edema.  Neurological: She is alert and oriented to person, place, and time. No cranial nerve deficit or sensory deficit. She exhibits normal muscle tone. Coordination normal.  Skin: Skin is warm and dry.  Psychiatric: She has a normal mood and affect.  Nursing note and vitals reviewed.  ED Treatments / Results  DIAGNOSTIC STUDIES:  Oxygen Saturation is 100% on RA, normal by my interpretation.    COORDINATION OF CARE:  4:14 PM Discussed treatment plan with pt at bedside and pt agreed to plan.   Labs (all labs ordered are listed, but only abnormal results are displayed) Labs Reviewed  URINALYSIS, ROUTINE W REFLEX MICROSCOPIC - Abnormal; Notable for the following:       Result Value   APPearance CLOUDY (*)    Hgb urine dipstick TRACE (*)    Ketones, ur 15 (*)    Leukocytes, UA LARGE (*)    All other components within normal limits  URINALYSIS, MICROSCOPIC (REFLEX) - Abnormal; Notable for the following:     Bacteria, UA FEW (*)    Squamous Epithelial / LPF 0-5 (*)    All other components within normal limits  URINE CULTURE  PREGNANCY, URINE    EKG  EKG Interpretation None       Radiology No results found.  Procedures Procedures (including critical care time)  Medications Ordered in ED Medications  cephALEXin (KEFLEX) capsule 500 mg (not administered)     Initial Impression / Assessment and Plan / ED Course  I have reviewed the triage vital signs and the nursing notes.  Pertinent labs & imaging results that were available during my care of the patient were reviewed by me and considered in my medical decision making (see chart for details).    Workup consistent with urinary tract infection. Urine sent for culture for confirmation. Proceed test negative. Patient without symptoms of of advanced upper pole tract infection no evidence of any CVA tenderness. Treat here with first dose of  by mouth Keflex and will continue Keflex for the next 7 days. Patient will return if no improvement over the next 2 days. Or for any new or worse symptoms.   Final Clinical Impressions(s) / ED Diagnoses   Final diagnoses:  Acute cystitis without hematuria    New Prescriptions New Prescriptions   CEPHALEXIN (KEFLEX) 500 MG CAPSULE    Take 1 capsule (500 mg total) by mouth 4 (four) times daily.    I personally performed the services described in this documentation, which was scribed in my presence. The recorded information has been reviewed and is accurate.       Vanetta Mulders, MD 02/11/17 1655

## 2017-02-14 LAB — URINE CULTURE: Culture: >=100000 — AB

## 2017-02-15 ENCOUNTER — Telehealth: Payer: Self-pay

## 2017-02-15 NOTE — Telephone Encounter (Signed)
Post ED Visit - Positive Culture Follow-up  Culture report reviewed by antimicrobial stewardship pharmacist:  []  Lori Wolfe, Pharm.D. []  Lori Wolfe, Pharm.D., BCPS AQ-ID []  Lori Wolfe, Pharm.D., BCPS []  Lori Wolfe, Pharm.D., BCPS []  Lori Wolfe, [place] BCPS, AAHIVP []  Lori Wolfe, Pharm.D., BCPS, AAHIVP []  Lori Wolfe, PharmD, BCPS []  Lori Wolfe, PharmD, BCPS []  Lori Wolfe, PharmD, BCPS Lori Wolfe Pharm D Positive urine culture Treated with Cehpalexin, organism sensitive to the same and no further patient follow-up is required at this time.  Lori Wolfe, Lori Wolfe 02/15/2017, 9:26 AM

## 2017-03-15 IMAGING — CT CT ABD-PELV W/ CM
2 of 4 series · 16 of 46 positions shown, 18 images · IV contrast (APPLIED)
Comparison: Pelvic ultrasound 11/03/2015, CT 07/05/2013

CLINICAL DATA: LEFT lower quadrant pain

EXAM:
CT ABDOMEN AND PELVIS WITH CONTRAST
TECHNIQUE: Multidetector CT imaging of the abdomen and pelvis was performed
using the standard protocol following bolus administration of
intravenous contrast.
CONTRAST:  100mL CPJIKH-A22 IOPAMIDOL (CPJIKH-A22) INJECTION 61%

[Series 2: axial st · axial · 0.79mm/px · z∈[-482,-92]mm · 13 of 86 slices shown, 15 images]
[im 4/86  soft-tissue]
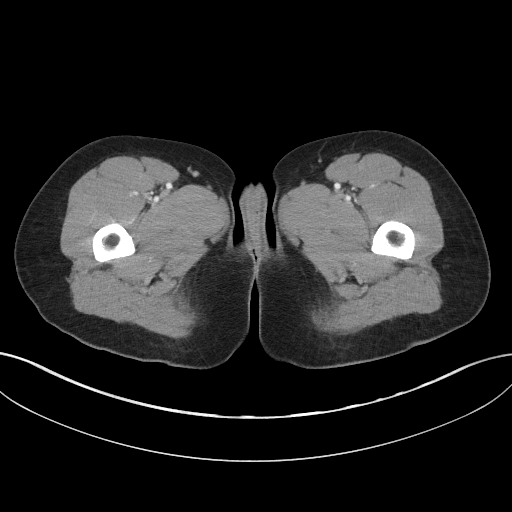
[im 4/86  bone]
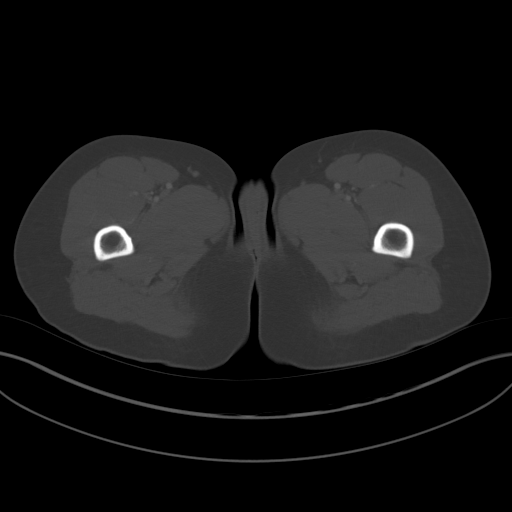
[im 11/86  soft-tissue]
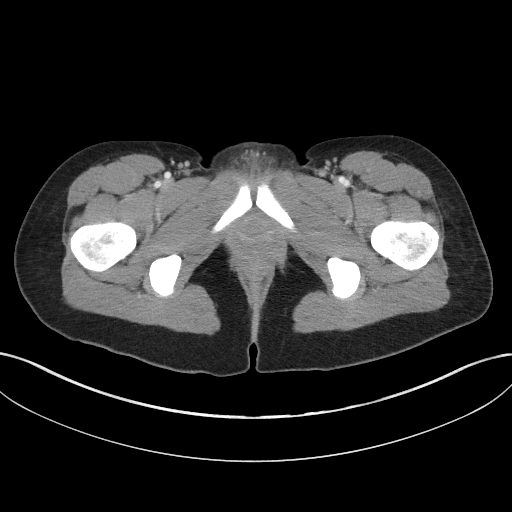
[im 18/86  soft-tissue]
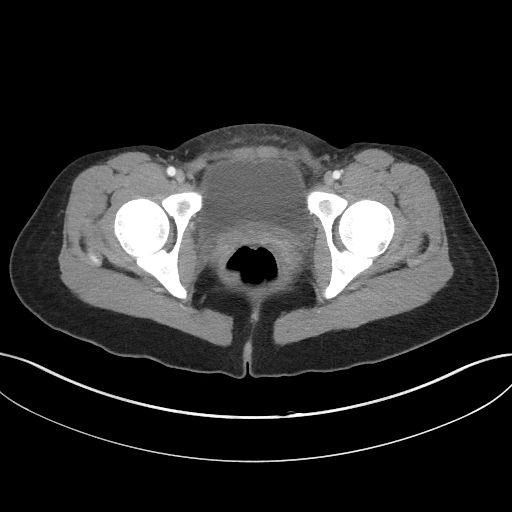
[im 25/86  soft-tissue]
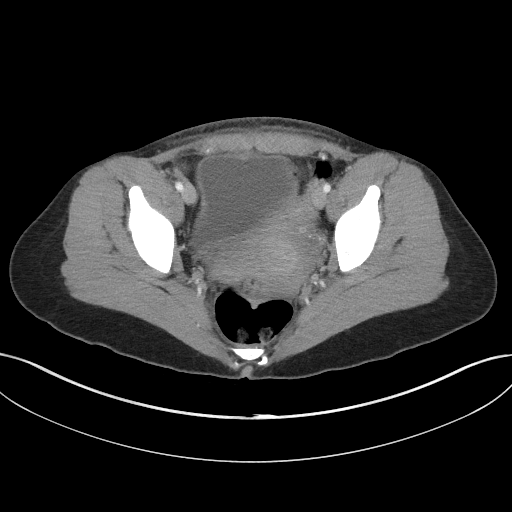
[im 29/86  soft-tissue]
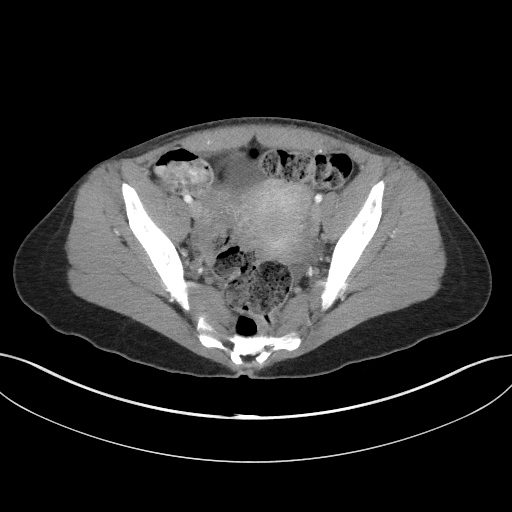
[im 36/86  soft-tissue]
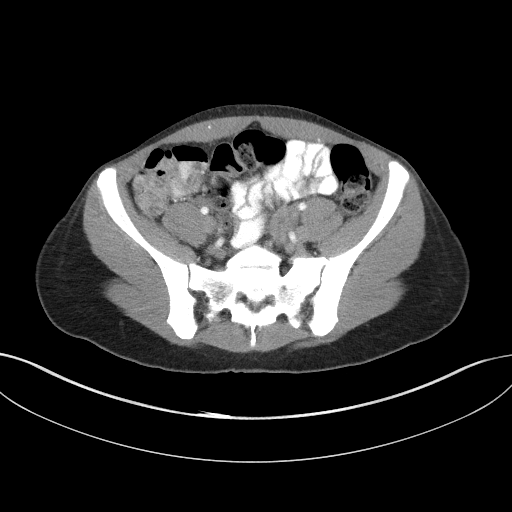
[im 43/86  soft-tissue]
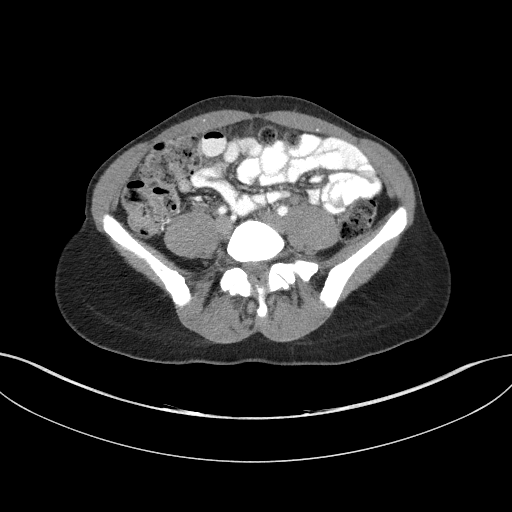
[im 50/86  soft-tissue]
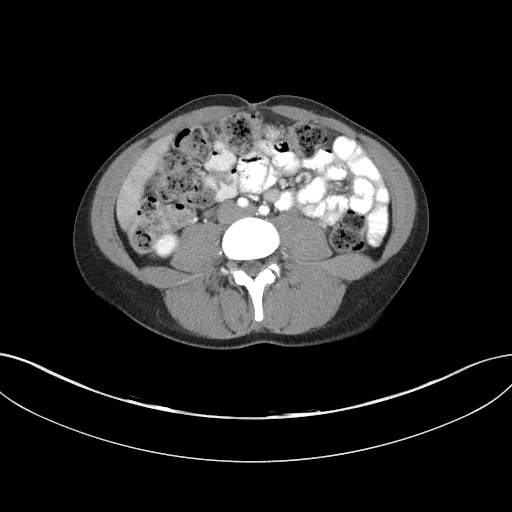
[im 57/86  soft-tissue]
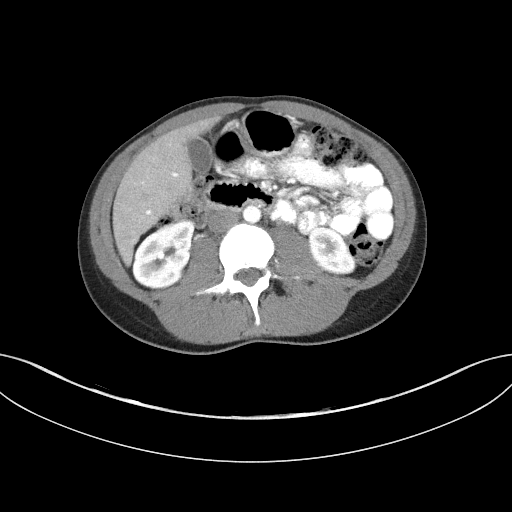
[im 57/86  bone]
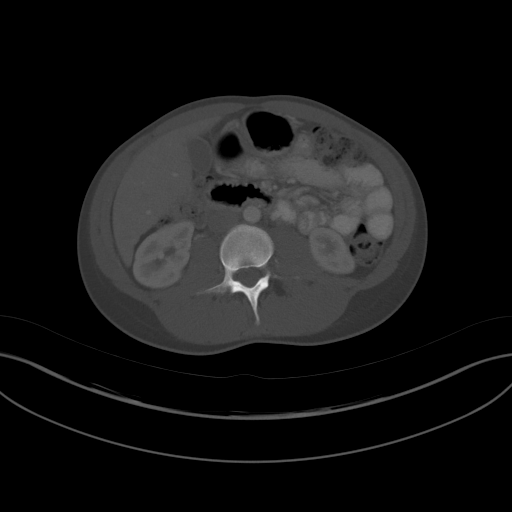
[im 61/86  soft-tissue]
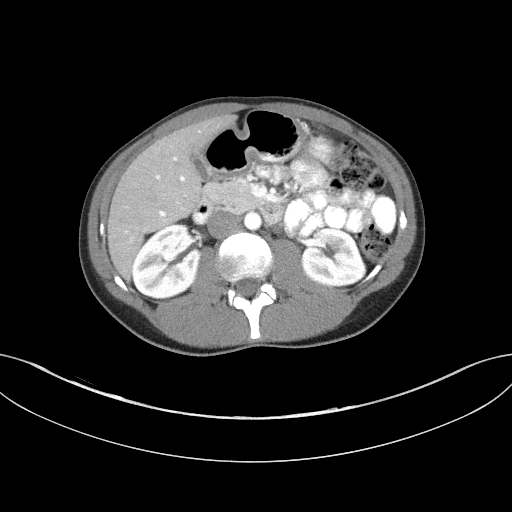
[im 68/86  soft-tissue]
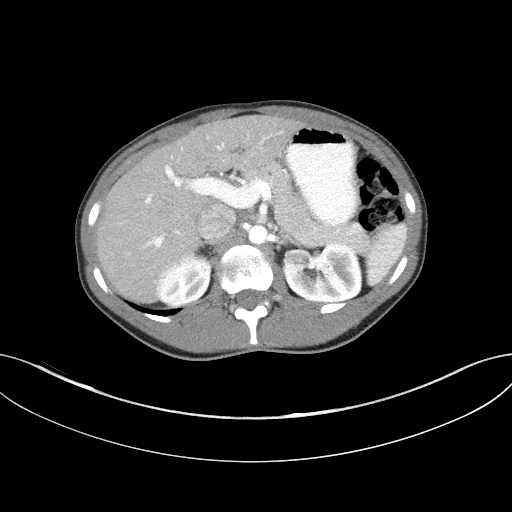
[im 75/86  soft-tissue]
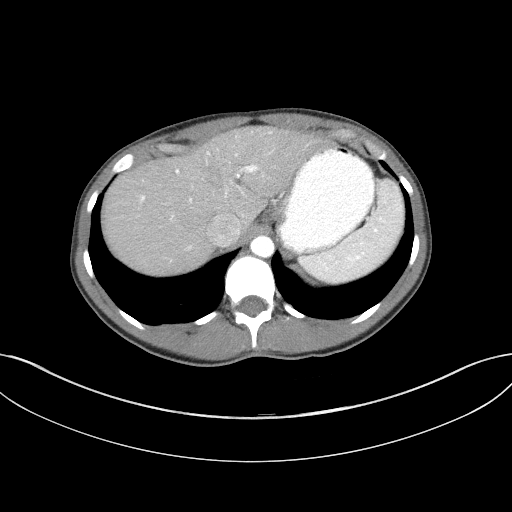
[im 82/86  soft-tissue]
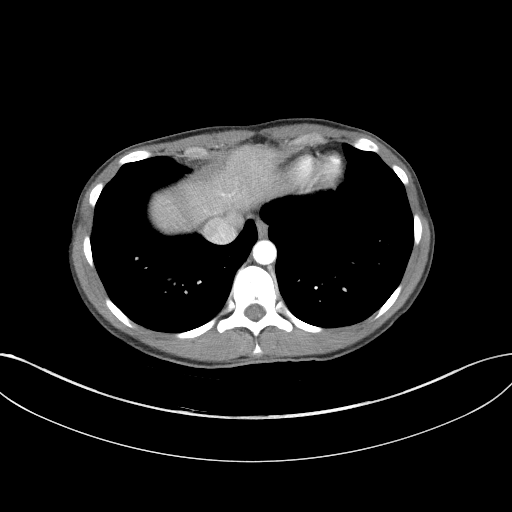

[Series 6: coronal st · coronal · 0.65mm/px · 3 of 73 slices shown]
[im 25/73  soft-tissue]
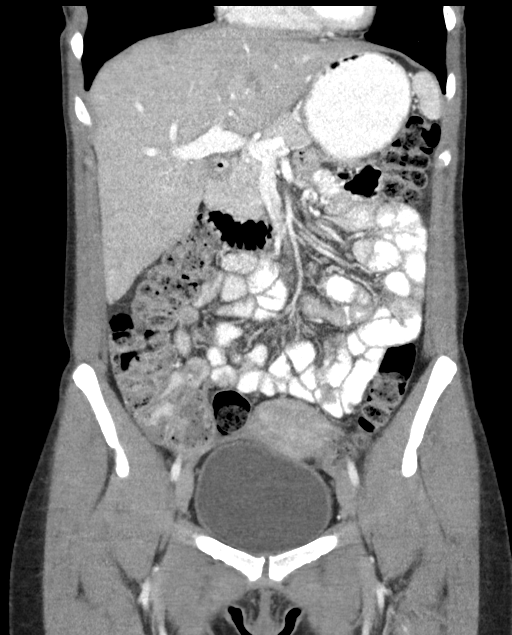
[im 33/73  soft-tissue]
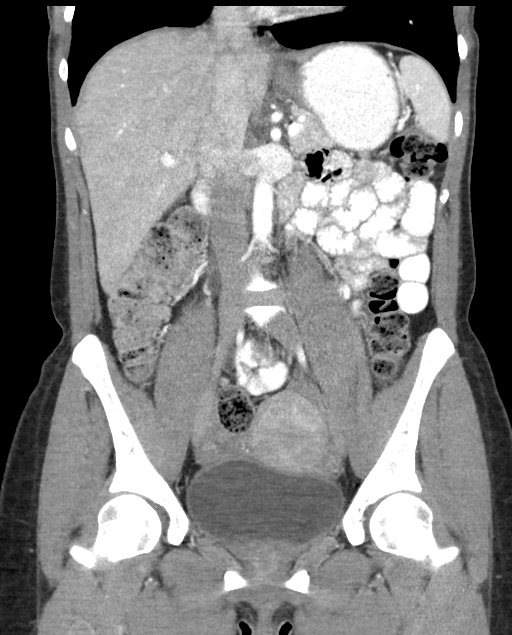
[im 41/73  soft-tissue]
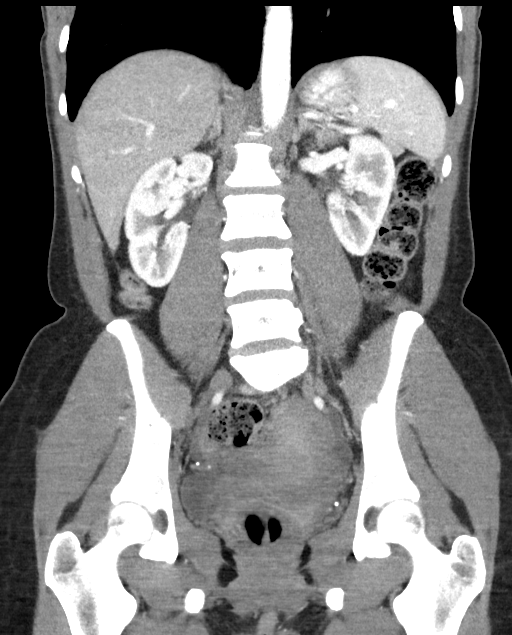

[16 of 46 positions shown; findings below may reference images not displayed]

FINDINGS: Lower chest: Lung bases are clear.

Hepatobiliary: No focal hepatic lesion. No biliary duct dilatation.
Gallbladder is normal. Common bile duct is normal.

Pancreas: Pancreas is normal. No ductal dilatation. No pancreatic
inflammation.

Spleen: Normal spleen

Adrenals/urinary tract: Adrenal glands and kidneys are normal. The
ureters and bladder normal.

Stomach/Bowel: Stomach, small-bowel, appendix, cecum are normal. The
colon and rectosigmoid colon are normal.

Vascular/Lymphatic: Abdominal aorta is normal caliber. There is no
retroperitoneal or periportal lymphadenopathy. No pelvic
lymphadenopathy.

Reproductive: Uterus is normal. The RIGHT ovary normal and seen on
coronal image 38. The LEFT ovary is more difficult to define but
appears to present on image 34, series 6 posterior to the uterine
fundus. The LEFT ovary is not enlarged.

Other: Trace free fluid the pelvis.

Musculoskeletal: No aggressive osseous lesion.
IMPRESSION: 1. No acute abdominal or pelvic findings.
2. Normal ovaries and uterus.
3. Normal appendix.
4. No obstructive uropathy.

## 2017-12-07 IMAGING — US US TRANSVAGINAL NON-OB
1 series · 13 of 25 positions shown · non-contrast
Comparison: None.

ADDENDUM:
Significant bowel gas within the LEFT lower quadrant obscured the
LEFT ovary.

Findings conveyed [REDACTED] on 11/03/2015  at[DATE].
CLINICAL DATA: Sporadic LLQ pain x 2-3 months but worse since
yesterday after working out at the gym. Started menses 2 days ago
EXAM:
TRANSABDOMINAL AND TRANSVAGINAL ULTRASOUND OF PELVIS
DOPPLER ULTRASOUND OF OVARIES
TECHNIQUE: Both transabdominal and transvaginal ultrasound examinations of the
pelvis were performed. Transabdominal technique was performed for
global imaging of the pelvis including uterus, ovaries, adnexal
regions, and pelvic cul-de-sac.
It was necessary to proceed with endovaginal exam following the
transabdominal exam to visualize the ovaries. Color and duplex
Doppler ultrasound was utilized to evaluate blood flow to the
ovaries.

[Series 1: us transvaginal non-ob · 0.21mm/px · 13 of 54 slices shown]
[im 1/54]
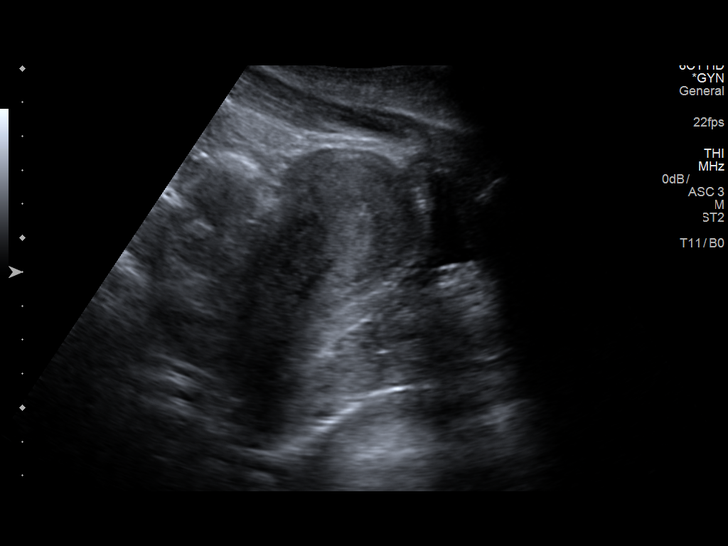
[im 5/54]
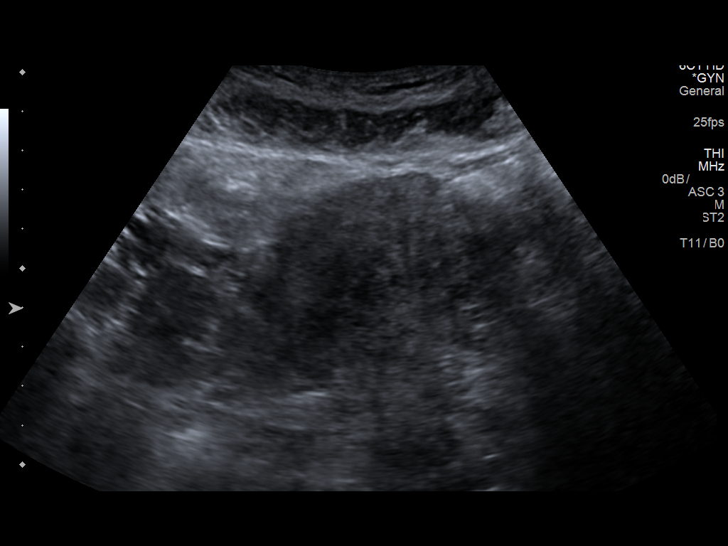
[im 9/54]
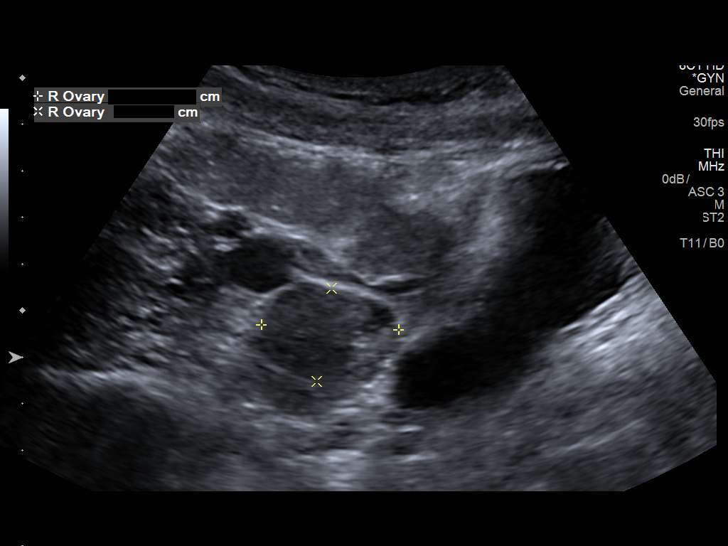
[im 14/54]
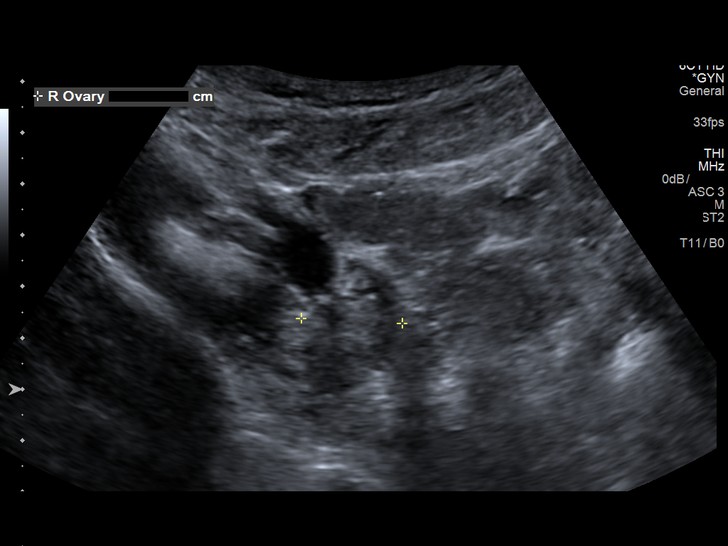
[im 18/54]
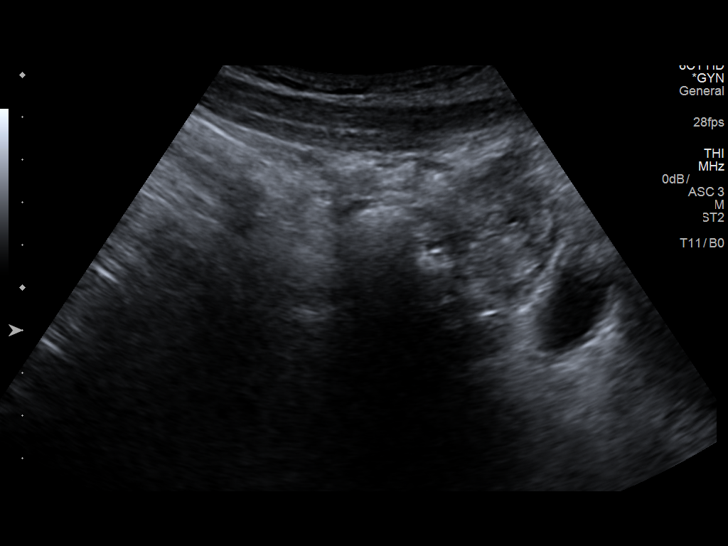
[im 23/54]
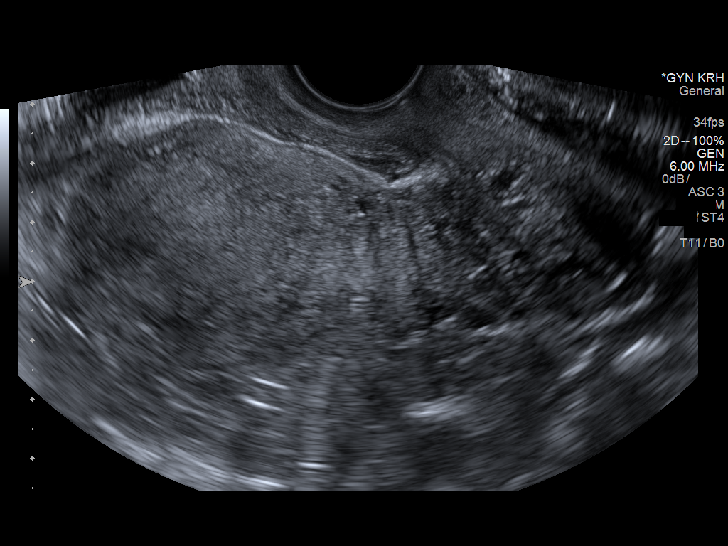
[im 27/54]
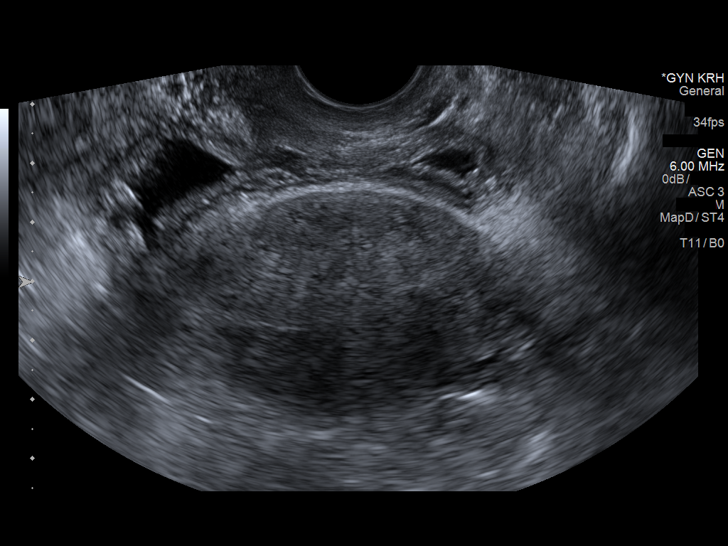
[im 31/54]
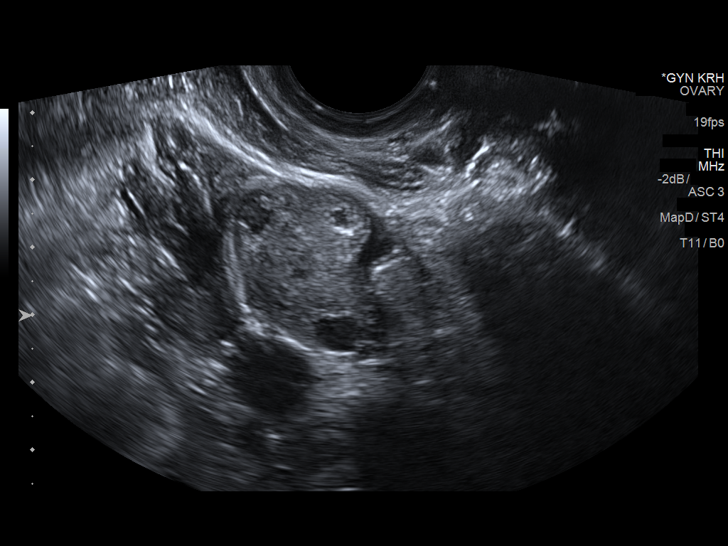
[im 36/54]
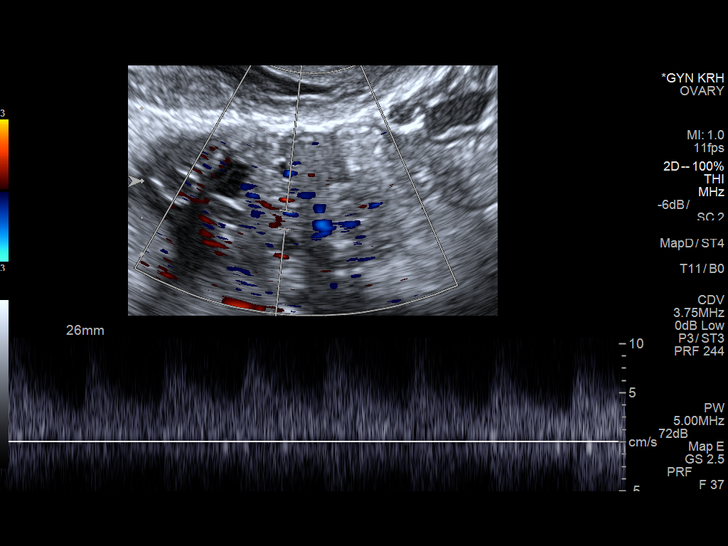
[im 40/54]
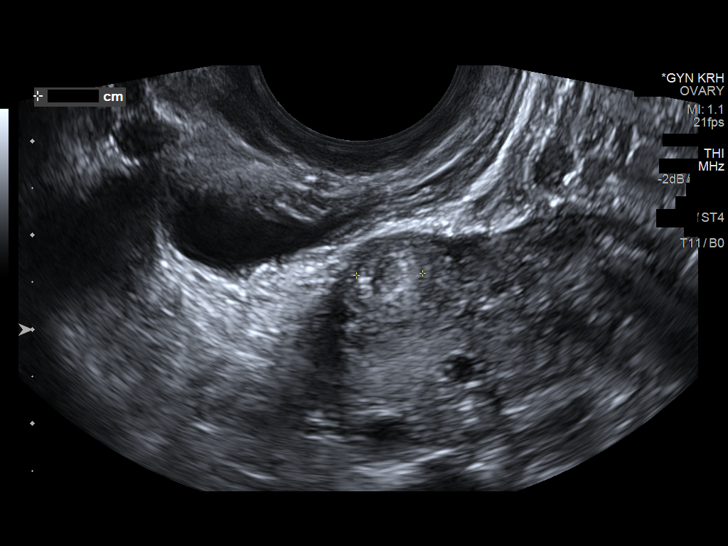
[im 45/54]
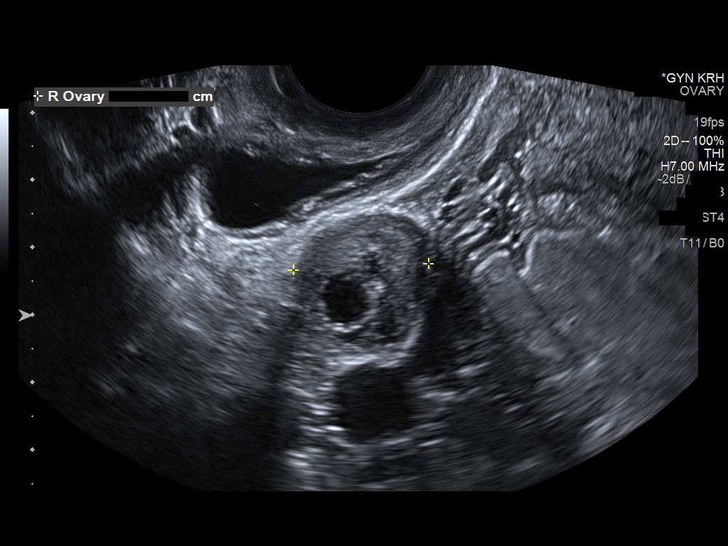
[im 49/54]
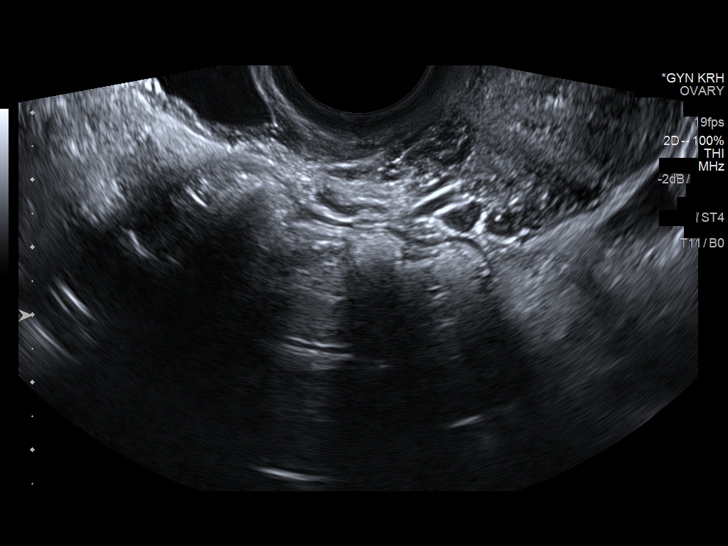
[im 54/54]
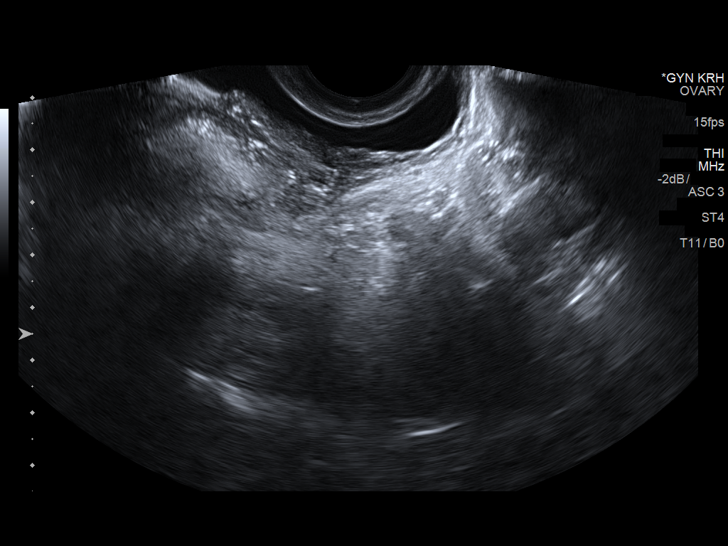

[13 of 25 positions shown; findings below may reference images not displayed]

FINDINGS: Uterus

Measurements: Normal in size and echotexture measuring 10.4 x 4.5 x
5.8 cm.. No fibroids or other mass visualized.

Endometrium

Thickness: Normal thickness for premenopausal female at 4 mm.. No
focal abnormality visualized.

Right ovary

Measurements: Normal in size at 2.8 x 2.1 x 2.0 cm.. Normal
follicles.

Left ovary

Measurements: Not identified by transabdominal or transvaginal
ultrasound..

Pulsed Doppler evaluation of RIGHT ovary demonstrates normal
low-resistance arterial and venous waveforms.

Other findings

Trace free fluid.
IMPRESSION: 1. Normal uterus.
2. Normal RIGHT ovary with normal vascular flow.
3. LEFT ovary not identified.

## 2018-11-20 ENCOUNTER — Other Ambulatory Visit: Payer: Self-pay

## 2018-11-20 ENCOUNTER — Encounter (HOSPITAL_BASED_OUTPATIENT_CLINIC_OR_DEPARTMENT_OTHER): Payer: Self-pay | Admitting: Emergency Medicine

## 2018-11-20 ENCOUNTER — Emergency Department (HOSPITAL_BASED_OUTPATIENT_CLINIC_OR_DEPARTMENT_OTHER)
Admission: EM | Admit: 2018-11-20 | Discharge: 2018-11-20 | Disposition: A | Discharge status: Home / Self Care | Payer: Self-pay | Attending: Emergency Medicine | Admitting: Emergency Medicine

## 2018-11-20 DIAGNOSIS — B9689 Other specified bacterial agents as the cause of diseases classified elsewhere: Secondary | ICD-10-CM | POA: Insufficient documentation

## 2018-11-20 DIAGNOSIS — N390 Urinary tract infection, site not specified: Secondary | ICD-10-CM | POA: Insufficient documentation

## 2018-11-20 DIAGNOSIS — N760 Acute vaginitis: Secondary | ICD-10-CM | POA: Insufficient documentation

## 2018-11-20 LAB — PREGNANCY, URINE: Preg Test, Ur: NEGATIVE

## 2018-11-20 LAB — URINALYSIS, ROUTINE W REFLEX MICROSCOPIC
Bilirubin Urine: NEGATIVE
Glucose, UA: NEGATIVE mg/dL
Ketones, ur: NEGATIVE mg/dL
Nitrite: NEGATIVE
Protein, ur: 30 mg/dL — AB
Specific Gravity, Urine: >1.03 — ABNORMAL HIGH (ref 1.005–1.030)
pH: 5.5 (ref 5.0–8.0)

## 2018-11-20 LAB — WET PREP, GENITAL
Sperm: NONE SEEN
Trich, Wet Prep: NONE SEEN
Yeast Wet Prep HPF POC: NONE SEEN

## 2018-11-20 LAB — URINALYSIS, MICROSCOPIC (REFLEX)

## 2018-11-20 MED ORDER — NITROFURANTOIN MONOHYD MACRO 100 MG PO CAPS
100.0000 mg | ORAL_CAPSULE | Freq: Once | ORAL | Status: AC
  Administered 2018-11-20: 22:00:00 100 mg via ORAL
  Filled 2018-11-20: qty 1

## 2018-11-20 MED ORDER — NITROFURANTOIN MONOHYD MACRO 100 MG PO CAPS: 100.0000 mg | ORAL_CAPSULE | Freq: Two times a day (BID) | ORAL | 0 refills | Status: AC

## 2018-11-20 MED ORDER — METRONIDAZOLE 500 MG PO TABS: 500.0000 mg | ORAL_TABLET | Freq: Two times a day (BID) | ORAL | 0 refills | Status: DC

## 2018-11-20 MED ORDER — NITROFURANTOIN MONOHYD MACRO 100 MG PO CAPS: 100.0000 mg | ORAL_CAPSULE | Freq: Two times a day (BID) | ORAL | 0 refills | Status: DC

## 2018-11-20 MED ORDER — METRONIDAZOLE 500 MG PO TABS
500.0000 mg | ORAL_TABLET | Freq: Once | ORAL | Status: AC
  Administered 2018-11-20: 500 mg via ORAL
  Filled 2018-11-20: qty 1

## 2018-11-20 MED ORDER — METRONIDAZOLE 500 MG PO TABS: 500.0000 mg | ORAL_TABLET | Freq: Two times a day (BID) | ORAL | 0 refills | Status: AC

## 2018-11-20 NOTE — ED Provider Notes (Signed)
[facility] EMERGENCY DEPARTMENT Provider Note   CSN: [id number] Arrival date & time: 11/20/18  1945    History   Chief Complaint Chief Complaint  Patient presents with  . Urinary Tract Infection    HPI Lori Wolfe is a 38 y.o. female.     38 year old female with past medical history and frequent UTIs presents emergency department for UTI symptoms.  Patient reports that the last couple of days she is feeling urgency, burning with urination and with some slight hematuria this morning.  Reports that this is similar to previous episodes of UTI.  Reports that she also has had some whitish discharge and thinks that it is a yeast infection but would like to be tested for STDs.  Denies any fever, pelvic pain, back pain, nausea, vomiting.     Past Medical History:  Diagnosis Date  . Ovarian cyst   . Pyelonephritis   . Urinary tract infection     Patient Active Problem List   Diagnosis Date Noted  . Active labor 11/05/2011    Past Surgical History:  Procedure Laterality Date  . NO PAST SURGERIES    . WISDOM TOOTH EXTRACTION       OB History    Gravida  4   Para  3   Term  3   Preterm  0   AB      Living  3     SAB  0   TAB  0   Ectopic  0   Multiple  0   Live Births  3            Home Medications    Prior to Admission medications   Not on File    Family History Family History  Problem Relation Age of Onset  . Anesthesia problems Neg Hx     Social History Social History   Tobacco Use  . Smoking status: Never Smoker  . Smokeless tobacco: Never Used  Substance Use Topics  . Alcohol use: Yes    Comment: occassional  . Drug use: No     Allergies   Patient has no known allergies.   Review of Systems Review of Systems   Physical Exam Updated Vital Signs BP 97/61 (BP Location: Right Arm)   Pulse 75   Temp 98.1 F (36.7 C) (Oral)   Resp 16   Ht  (1.626 m)   Wt 54.4 kg   LMP 10/27/2018 (Approximate)    SpO2 100%   BMI 20.60 kg/m   Physical Exam Vitals signs and nursing note reviewed. Exam conducted with a chaperone present.  Constitutional:      General: She is not in acute distress.    Appearance: She is well-developed.  HENT:     Head: Normocephalic and atraumatic.  Eyes:     Conjunctiva/sclera: Conjunctivae normal.  Neck:     Musculoskeletal: Neck supple.  Cardiovascular:     Rate and Rhythm: Normal rate and regular rhythm.     Heart sounds: No murmur.  Pulmonary:     Effort: Pulmonary effort is normal. No respiratory distress.     Breath sounds: Normal breath sounds.  Abdominal:     Palpations: Abdomen is soft.     Tenderness: There is no abdominal tenderness. There is no right CVA tenderness or left CVA tenderness.  Genitourinary:    Exam position: Supine.     Pubic Area: No rash or pubic lice.      Labia:  Right: No rash, tenderness, lesion or injury.        Left: No rash, tenderness, lesion or injury.      Urethra: No prolapse, urethral pain, urethral swelling or urethral lesion.     Vagina: Vaginal discharge present.     Cervix: No cervical motion tenderness.     Adnexa: Right adnexa normal and left adnexa normal.       Right: No tenderness.         Left: No tenderness.    Skin:    General: Skin is warm and dry.     Capillary Refill: Capillary refill takes less than 2 seconds.  Neurological:     General: No focal deficit present.     Mental Status: She is alert.  Psychiatric:        Mood and Affect: Mood normal.      ED Treatments / Results  Labs (all labs ordered are listed, but only abnormal results are displayed) Labs Reviewed  WET PREP, GENITAL - Abnormal; Notable for the following components:      Result Value   Clue Cells Wet Prep HPF POC PRESENT (*)    WBC, Wet Prep HPF POC MANY (*)    All other components within normal limits  URINALYSIS, ROUTINE W REFLEX MICROSCOPIC - Abnormal; Notable for the following components:   APPearance CLOUDY  (*)    Specific Gravity, Urine >1.030 (*)    Hgb urine dipstick SMALL (*)    Protein, ur 30 (*)    Leukocytes,Ua SMALL (*)    All other components within normal limits  URINALYSIS, MICROSCOPIC (REFLEX) - Abnormal; Notable for the following components:   Bacteria, UA MANY (*)    All other components within normal limits  PREGNANCY, URINE  GC/CHLAMYDIA PROBE AMP ([place]) NOT AT [facility]    EKG None  Radiology No results found.  Procedures Procedures (including critical care time)  Medications Ordered in ED Medications  nitrofurantoin (macrocrystal-monohydrate) (MACROBID) capsule 100 mg (has no administration in time range)  metroNIDAZOLE (FLAGYL) tablet 500 mg (has no administration in time range)     Initial Impression / Assessment and Plan / ED Course  I have reviewed the triage vital signs and the nursing notes.  Pertinent labs & imaging results that were available during my care of the patient were reviewed by me and considered in my medical decision making (see chart for details).  Clinical Course as of Nov 19 2200  Thu Nov 20, 2018  2155 UA consistent with probable UTI in the context of her symptoms and hx of the same. Also has clue cells and vaginal discharge. Will treat for UTI and vaginosis. Pending STD culture. Patient advised no sex until she knows her results are negative. No known exposure to STD and would like to wait on STD treatments. No signs of PID on my exam.  Previous urine culture grew E. coli which was resistant to Bactrim and Cipro.  It was suspectible to Macrobid so I will start this   [KM]    Clinical Course User Index [KM] Arlyn Dunning, PA-C       Based on review of vitals, medical screening exam, lab work and/or imaging, there does not appear to be an acute, emergent etiology for the patient's symptoms. Counseled pt on good return precautions and encouraged both PCP and ED follow-up as needed.  Prior to discharge, I also discussed incidental  imaging findings with patient in detail and advised appropriate, recommended follow-up in  detail.  Clinical Impression: 1. Lower urinary tract infectious disease   2. BV (bacterial vaginosis)     Disposition: Discharge  Prior to providing a prescription for a controlled substance, I independently reviewed the patient's recent prescription history on the [place] Controlled Substance Reporting System. The patient had no recent or regular prescriptions and was deemed appropriate for a brief, less than 3 day prescription of narcotic for acute analgesia.  This note was prepared with assistance of Conservation officer, historic buildingsDragon voice recognition software. Occasional wrong-word or sound-a-like substitutions may have occurred due to the inherent limitations of voice recognition software.   Final Clinical Impressions(s) / ED Diagnoses   Final diagnoses:  Lower urinary tract infectious disease  BV (bacterial vaginosis)    ED Discharge Orders    None       Jeral PinchMcLean, Oliana Gowens A, PA-C 11/20/18 2202    Melene PlanFloyd, Dan, DO 11/20/18 2253

## 2018-11-20 NOTE — ED Notes (Signed)
Pt states that she is prone to UTI's and kidney infections, started having urinary frequency a few days ago and started taking cranberry pills and increasing her water intake. Today she noticed blood in her urine so she decided to come in. She denies fevers, denies back pain.

## 2018-11-20 NOTE — ED Triage Notes (Signed)
Pt reports urinary pressure and frequency starting yesterday with hematuria starting this morning.

## 2018-11-20 NOTE — Discharge Instructions (Addendum)
No sex until you follow up on your STD lab results. Thank you for allowing me to care for you today. Please return to the emergency department if you have new or worsening symptoms. Take your medications as instructed.

## 2018-11-24 LAB — GC/CHLAMYDIA PROBE AMP (~~LOC~~) NOT AT ARMC
Chlamydia: NEGATIVE
Neisseria Gonorrhea: NEGATIVE

## 2024-07-01 ENCOUNTER — Ambulatory Visit
Admission: EM | Admit: 2024-07-01 | Discharge: 2024-07-01 | Disposition: A | Discharge status: Home / Self Care | Attending: Family Medicine | Admitting: Family Medicine

## 2024-07-01 DIAGNOSIS — J101 Influenza due to other identified influenza virus with other respiratory manifestations: Secondary | ICD-10-CM

## 2024-07-01 DIAGNOSIS — R051 Acute cough: Secondary | ICD-10-CM | POA: Diagnosis not present

## 2024-07-01 LAB — POC COVID19/FLU A&B COMBO
Covid Antigen, POC: NEGATIVE
Influenza A Antigen, POC: POSITIVE — AB
Influenza B Antigen, POC: NEGATIVE

## 2024-07-01 MED ORDER — BENZONATATE 200 MG PO CAPS: 200.0000 mg | ORAL_CAPSULE | Freq: Three times a day (TID) | ORAL | 0 refills | Status: AC | PRN

## 2024-07-01 NOTE — Discharge Instructions (Addendum)
 You have tested positive for the flu.  You may take Tessalon  3 times a day as needed for your cough.  Focus on hydration and rest.  You may use over-the-counter Tylenol  or ibuprofen  as needed for fever.  Follow-up with your PCP if your symptoms do not improve.  Please go to the emergency room for any worsening symptoms.  Hope you feel better soon!

## 2024-07-01 NOTE — ED Provider Notes (Addendum)
 [facility]    CSN: [id number] Arrival date & time: 07/01/24  1452      History   Chief Complaint Chief Complaint  Patient presents with   Cough    HPI Lori Wolfe is a 43 y.o. female  presents for evaluation of URI symptoms for 1 days. Patient reports associated symptoms of fever, headache, cough and congestion. Denies N/V/D, sore throat, ear pain, body aches, shortness of breath. Patient does not have a hx of asthma. Patient is not an active smoker.   Reports was at a cheerleading competition recently and exposed to multiple people.  Pt has taken DayQuil NyQuil and ibuprofen  OTC for symptoms. Pt has no other concerns at this time.    Cough Associated symptoms: fever and headaches     Past Medical History:  Diagnosis Date   Ovarian cyst    Pyelonephritis    Urinary tract infection     Patient Active Problem List   Diagnosis Date Noted   Active labor 11/05/2011    Past Surgical History:  Procedure Laterality Date   NO PAST SURGERIES     WISDOM TOOTH EXTRACTION      OB History     Gravida  4   Para  3   Term  3   Preterm  0   AB      Living  3      SAB  0   IAB  0   Ectopic  0   Multiple  0   Live Births  3            Home Medications    Prior to Admission medications  Medication Sig Start Date End Date Taking? Authorizing Provider  amitriptyline (ELAVIL) 10 MG tablet Take 10 mg by mouth. 04/21/24  Yes [provider]  benzonatate  (TESSALON ) 200 MG capsule Take 1 capsule (200 mg total) by mouth 3 (three) times daily as needed. 07/01/24  Yes Jannat Rosemeyer, Jodi R, NP  etonogestrel-ethinyl estradiol (NUVARING) 0.12-0.015 MG/24HR vaginal ring INSERT 1 RING VAGINALLY AND LEAVE IN PLACE FOR 3 WEEKS, THEN REMOVE FOR 1 WEEK 03/17/24  Yes [provider]  SUMAtriptan (IMITREX) 50 MG tablet Take 1 tablet once. Repeat again in 2 hours if headache persists. No more than 2 tablets in 24 hours. 04/02/23  Yes [provider]  metroNIDAZOLE  (FLAGYL ) 500 MG tablet Take 1 tablet (500 mg total) by mouth 2 (two) times daily. 11/20/18   Rolan Burnard LABOR, PA-C  nitrofurantoin , macrocrystal-monohydrate, (MACROBID ) 100 MG capsule Take 1 capsule (100 mg total) by mouth 2 (two) times daily. 11/20/18   Rolan Burnard LABOR, PA-C    Family History Family History  Problem Relation Age of Onset   Anesthesia problems Neg Hx     Social History Social History[1]   Allergies   Patient has no known allergies.   Review of Systems Review of Systems  Constitutional:  Positive for fever.  HENT:  Positive for congestion.   Respiratory:  Positive for cough.   Neurological:  Positive for headaches.     Physical Exam Triage Vital Signs ED Triage Vitals  Encounter Vitals Group     BP 07/01/24 1600 110/63     Girls Systolic BP Percentile --      Girls Diastolic BP Percentile --      Boys Systolic BP Percentile --      Boys Diastolic BP Percentile --      Pulse Rate 07/01/24 1600 (!) 109  Resp 07/01/24 1600 16     Temp 07/01/24 1600 99.4 F (37.4 C)     Temp Source 07/01/24 1600 Oral     SpO2 07/01/24 1600 98 %     Weight --      Height --      Head Circumference --      Peak Flow --      Pain Score 07/01/24 1558 0     Pain Loc --      Pain Education --      Exclude from Growth Chart --    No data found.  Updated Vital Signs BP 110/63 (BP Location: Right Arm)   Pulse (!) 109   Temp 99.4 F (37.4 C) (Oral)   Resp 16   LMP 06/28/2024 (Approximate)   SpO2 98%   Visual Acuity Right Eye Distance:   Left Eye Distance:   Bilateral Distance:    Right Eye Near:   Left Eye Near:    Bilateral Near:     Physical Exam Vitals and nursing note reviewed.  Constitutional:      General: She is not in acute distress.    Appearance: She is well-developed. She is not ill-appearing.  HENT:     Head: Normocephalic and atraumatic.     Right Ear: Tympanic membrane and ear canal normal.     Left Ear:  Tympanic membrane and ear canal normal.     Nose: Congestion present.     Mouth/Throat:     Mouth: Mucous membranes are moist.     Pharynx: Oropharynx is clear. Uvula midline. Postnasal drip present. No oropharyngeal exudate or posterior oropharyngeal erythema.     Tonsils: No tonsillar exudate or tonsillar abscesses.  Eyes:     Conjunctiva/sclera: Conjunctivae normal.     Pupils: Pupils are equal, round, and reactive to light.  Cardiovascular:     Rate and Rhythm: Normal rate and regular rhythm.     Heart sounds: Normal heart sounds.  Pulmonary:     Effort: Pulmonary effort is normal.     Breath sounds: Normal breath sounds. No wheezing, rhonchi or rales.  Musculoskeletal:     Cervical back: Normal range of motion and neck supple.  Lymphadenopathy:     Cervical: No cervical adenopathy.  Skin:    General: Skin is warm and dry.  Neurological:     General: No focal deficit present.     Mental Status: She is alert and oriented to person, place, and time.  Psychiatric:        Mood and Affect: Mood normal.        Behavior: Behavior normal.      UC Treatments / Results  Labs (all labs ordered are listed, but only abnormal results are displayed) Labs Reviewed  POC COVID19/FLU A&B COMBO - Abnormal; Notable for the following components:      Result Value   Influenza A Antigen, POC Positive (*)    All other components within normal limits    EKG   Radiology No results found.  Procedures Procedures (including critical care time)  Medications Ordered in UC Medications - No data to display  Initial Impression / Assessment and Plan / UC Course  I have reviewed the triage vital signs and the nursing notes.  Pertinent labs & imaging results that were available during my care of the patient were reviewed by me and considered in my medical decision making (see chart for details).     Positive influenza A, reviewed with  patient.  No comorbidities indicating need for antiviral  treatment.  Discussed viral illness and symptomatic treatment.  Tessalon  as needed for cough.  Encourage rest fluids and PCP follow-up if symptoms do not improve.  ER precautions reviewed. Final Clinical Impressions(s) / UC Diagnoses   Final diagnoses:  Acute cough  Influenza A     Discharge Instructions      You have tested positive for the flu.  You may take Tessalon  3 times a day as needed for your cough.  Focus on hydration and rest.  You may use over-the-counter Tylenol  or ibuprofen  as needed for fever.  Follow-up with your PCP if your symptoms do not improve.  Please go to the emergency room for any worsening symptoms.  Hope you feel better soon!     ED Prescriptions     Medication Sig Dispense Auth. Provider   benzonatate  (TESSALON ) 200 MG capsule Take 1 capsule (200 mg total) by mouth 3 (three) times daily as needed. 20 capsule Dael Howland, Jodi R, NP      PDMP not reviewed this encounter.    Loreda Myla SAUNDERS, NP 07/01/24 1703     [1]  Social History Tobacco Use   Smoking status: Never   Smokeless tobacco: Never  Vaping Use   Vaping status: Never Used  Substance Use Topics   Alcohol use: Yes    Comment: occassional   Drug use: No     Loreda Myla SAUNDERS, NP 07/01/24 1704

## 2024-07-01 NOTE — ED Triage Notes (Signed)
 Pt states fever cough and headache since yesterday.  States she is taking dayquil and nyquil at home.
